# Patient Record
Sex: Female | Born: 1965
Health system: Southern US, Community
[De-identification: ages and names within clinical notes are randomized; demographics above are authoritative.]

## PROBLEM LIST (undated history)

## (undated) DIAGNOSIS — E119 Type 2 diabetes mellitus without complications: Secondary | ICD-10-CM

## (undated) DIAGNOSIS — J309 Allergic rhinitis, unspecified: Secondary | ICD-10-CM

## (undated) DIAGNOSIS — E785 Hyperlipidemia, unspecified: Secondary | ICD-10-CM

## (undated) DIAGNOSIS — J324 Chronic pansinusitis: Secondary | ICD-10-CM

## (undated) DIAGNOSIS — E559 Vitamin D deficiency, unspecified: Secondary | ICD-10-CM

## (undated) DIAGNOSIS — L309 Dermatitis, unspecified: Secondary | ICD-10-CM

## (undated) DIAGNOSIS — E0590 Thyrotoxicosis, unspecified without thyrotoxic crisis or storm: Secondary | ICD-10-CM

## (undated) DIAGNOSIS — J329 Chronic sinusitis, unspecified: Secondary | ICD-10-CM

## (undated) DIAGNOSIS — K219 Gastro-esophageal reflux disease without esophagitis: Secondary | ICD-10-CM

## (undated) DIAGNOSIS — E0500 Thyrotoxicosis with diffuse goiter without thyrotoxic crisis or storm: Secondary | ICD-10-CM

## (undated) DIAGNOSIS — R011 Cardiac murmur, unspecified: Secondary | ICD-10-CM

## (undated) HISTORY — DX: Chronic pansinusitis: J32.4

## (undated) HISTORY — DX: Gastro-esophageal reflux disease without esophagitis: K21.9

## (undated) HISTORY — DX: Chronic sinusitis, unspecified: J32.9

## (undated) HISTORY — DX: Thyrotoxicosis with diffuse goiter without thyrotoxic crisis or storm: E05.00

## (undated) HISTORY — DX: Cardiac murmur, unspecified: R01.1

## (undated) HISTORY — PX: OVARIAN CYST REMOVAL: SHX89

## (undated) HISTORY — DX: Vitamin D deficiency, unspecified: E55.9

## (undated) HISTORY — DX: Hyperlipidemia, unspecified: E78.5

## (undated) HISTORY — DX: Type 2 diabetes mellitus without complications: E11.9

## (undated) HISTORY — DX: Dermatitis, unspecified: L30.9

## (undated) HISTORY — PX: CHOLECYSTECTOMY: SHX55

## (undated) HISTORY — DX: Allergic rhinitis, unspecified: J30.9

## (undated) HISTORY — DX: Thyrotoxicosis, unspecified without thyrotoxic crisis or storm: E05.90

---

## 1998-01-10 ENCOUNTER — Ambulatory Visit (HOSPITAL_COMMUNITY): Admission: RE | Admit: 1998-01-10 | Discharge: 1998-01-10 | Payer: Self-pay | Admitting: *Deleted

## 1998-02-14 ENCOUNTER — Inpatient Hospital Stay (HOSPITAL_COMMUNITY): Admission: AD | Admit: 1998-02-14 | Discharge: 1998-02-14 | Payer: Self-pay | Admitting: *Deleted

## 1999-12-12 ENCOUNTER — Emergency Department (HOSPITAL_COMMUNITY): Admission: EM | Admit: 1999-12-12 | Discharge: 1999-12-12 | Payer: Self-pay | Admitting: Emergency Medicine

## 2000-02-02 ENCOUNTER — Encounter: Payer: Self-pay | Admitting: Gynecology

## 2000-02-02 ENCOUNTER — Ambulatory Visit (HOSPITAL_COMMUNITY): Admission: RE | Admit: 2000-02-02 | Discharge: 2000-02-02 | Payer: Self-pay | Admitting: Gynecology

## 2000-02-29 ENCOUNTER — Emergency Department (HOSPITAL_COMMUNITY): Admission: EM | Admit: 2000-02-29 | Discharge: 2000-02-29 | Payer: Self-pay

## 2000-03-15 ENCOUNTER — Encounter: Admission: RE | Admit: 2000-03-15 | Discharge: 2000-05-03 | Payer: Self-pay | Admitting: *Deleted

## 2000-04-13 ENCOUNTER — Encounter: Admission: RE | Admit: 2000-04-13 | Discharge: 2000-07-12 | Payer: Self-pay | Admitting: Gynecology

## 2000-06-23 ENCOUNTER — Inpatient Hospital Stay (HOSPITAL_COMMUNITY): Admission: AD | Admit: 2000-06-23 | Discharge: 2000-06-26 | Payer: Self-pay | Admitting: Gynecology

## 2000-07-30 ENCOUNTER — Encounter: Payer: Self-pay | Admitting: *Deleted

## 2000-07-30 ENCOUNTER — Ambulatory Visit (HOSPITAL_COMMUNITY): Admission: RE | Admit: 2000-07-30 | Discharge: 2000-07-30 | Payer: Self-pay | Admitting: *Deleted

## 2000-08-06 ENCOUNTER — Other Ambulatory Visit: Admission: RE | Admit: 2000-08-06 | Discharge: 2000-08-06 | Payer: Self-pay | Admitting: Gynecology

## 2000-08-07 ENCOUNTER — Emergency Department (HOSPITAL_COMMUNITY): Admission: EM | Admit: 2000-08-07 | Discharge: 2000-08-07 | Payer: Self-pay

## 2000-08-16 ENCOUNTER — Ambulatory Visit (HOSPITAL_COMMUNITY): Admission: RE | Admit: 2000-08-16 | Discharge: 2000-08-16 | Payer: Self-pay | Admitting: Family Medicine

## 2000-08-16 ENCOUNTER — Encounter: Payer: Self-pay | Admitting: Family Medicine

## 2001-02-12 ENCOUNTER — Emergency Department (HOSPITAL_COMMUNITY): Admission: EM | Admit: 2001-02-12 | Discharge: 2001-02-13 | Payer: Self-pay | Admitting: Emergency Medicine

## 2001-02-14 ENCOUNTER — Encounter: Payer: Self-pay | Admitting: *Deleted

## 2001-02-14 ENCOUNTER — Encounter: Admission: RE | Admit: 2001-02-14 | Discharge: 2001-02-14 | Payer: Self-pay | Admitting: *Deleted

## 2001-03-10 ENCOUNTER — Encounter (INDEPENDENT_AMBULATORY_CARE_PROVIDER_SITE_OTHER): Payer: Self-pay | Admitting: *Deleted

## 2001-03-10 ENCOUNTER — Encounter: Payer: Self-pay | Admitting: Gastroenterology

## 2001-03-10 ENCOUNTER — Ambulatory Visit (HOSPITAL_COMMUNITY): Admission: RE | Admit: 2001-03-10 | Discharge: 2001-03-10 | Payer: Self-pay | Admitting: Gastroenterology

## 2001-06-09 ENCOUNTER — Ambulatory Visit (HOSPITAL_COMMUNITY): Admission: RE | Admit: 2001-06-09 | Discharge: 2001-06-09 | Payer: Self-pay | Admitting: *Deleted

## 2001-06-09 ENCOUNTER — Encounter: Payer: Self-pay | Admitting: *Deleted

## 2001-07-18 ENCOUNTER — Ambulatory Visit (HOSPITAL_COMMUNITY): Admission: RE | Admit: 2001-07-18 | Discharge: 2001-07-18 | Payer: Self-pay | Admitting: Orthopedic Surgery

## 2001-07-18 ENCOUNTER — Encounter: Payer: Self-pay | Admitting: Orthopedic Surgery

## 2001-07-23 ENCOUNTER — Emergency Department (HOSPITAL_COMMUNITY): Admission: EM | Admit: 2001-07-23 | Discharge: 2001-07-23 | Payer: Self-pay | Admitting: Nurse Practitioner

## 2001-07-23 ENCOUNTER — Encounter: Payer: Self-pay | Admitting: Emergency Medicine

## 2002-01-13 ENCOUNTER — Encounter: Payer: Self-pay | Admitting: *Deleted

## 2002-01-13 ENCOUNTER — Encounter: Admission: RE | Admit: 2002-01-13 | Discharge: 2002-01-13 | Payer: Self-pay | Admitting: *Deleted

## 2002-06-09 ENCOUNTER — Ambulatory Visit (HOSPITAL_COMMUNITY): Admission: RE | Admit: 2002-06-09 | Discharge: 2002-06-09 | Payer: Self-pay | Admitting: *Deleted

## 2002-09-07 ENCOUNTER — Other Ambulatory Visit: Admission: RE | Admit: 2002-09-07 | Discharge: 2002-09-07 | Payer: Self-pay | Admitting: *Deleted

## 2002-10-28 ENCOUNTER — Emergency Department (HOSPITAL_COMMUNITY): Admission: EM | Admit: 2002-10-28 | Discharge: 2002-10-28 | Payer: Self-pay | Admitting: Emergency Medicine

## 2003-01-16 ENCOUNTER — Encounter (HOSPITAL_COMMUNITY): Admission: RE | Admit: 2003-01-16 | Discharge: 2003-04-16 | Payer: Self-pay | Admitting: Family Medicine

## 2003-01-16 ENCOUNTER — Encounter: Payer: Self-pay | Admitting: Family Medicine

## 2003-06-20 ENCOUNTER — Encounter: Admission: RE | Admit: 2003-06-20 | Discharge: 2003-06-20 | Payer: Self-pay | Admitting: Gastroenterology

## 2003-06-20 ENCOUNTER — Encounter: Payer: Self-pay | Admitting: Gastroenterology

## 2003-07-26 ENCOUNTER — Encounter: Payer: Self-pay | Admitting: Surgery

## 2003-07-30 ENCOUNTER — Encounter (INDEPENDENT_AMBULATORY_CARE_PROVIDER_SITE_OTHER): Payer: Self-pay

## 2003-07-30 ENCOUNTER — Observation Stay (HOSPITAL_COMMUNITY): Admission: RE | Admit: 2003-07-30 | Discharge: 2003-07-31 | Payer: Self-pay | Admitting: Surgery

## 2003-07-30 ENCOUNTER — Encounter: Payer: Self-pay | Admitting: Surgery

## 2004-01-01 ENCOUNTER — Other Ambulatory Visit: Admission: RE | Admit: 2004-01-01 | Discharge: 2004-01-01 | Payer: Self-pay | Admitting: Gynecology

## 2004-03-08 ENCOUNTER — Inpatient Hospital Stay (HOSPITAL_COMMUNITY): Admission: AD | Admit: 2004-03-08 | Discharge: 2004-03-08 | Payer: Self-pay | Admitting: Gynecology

## 2005-04-02 ENCOUNTER — Inpatient Hospital Stay (HOSPITAL_COMMUNITY): Admission: EM | Admit: 2005-04-02 | Discharge: 2005-04-04 | Payer: Self-pay | Admitting: Emergency Medicine

## 2006-03-01 ENCOUNTER — Ambulatory Visit (HOSPITAL_BASED_OUTPATIENT_CLINIC_OR_DEPARTMENT_OTHER): Admission: RE | Admit: 2006-03-01 | Discharge: 2006-03-01 | Payer: Self-pay | Admitting: Urology

## 2006-03-01 ENCOUNTER — Encounter (INDEPENDENT_AMBULATORY_CARE_PROVIDER_SITE_OTHER): Payer: Self-pay | Admitting: Specialist

## 2007-12-20 ENCOUNTER — Encounter: Admission: RE | Admit: 2007-12-20 | Discharge: 2007-12-20 | Payer: Self-pay | Admitting: Family Medicine

## 2008-01-30 ENCOUNTER — Ambulatory Visit: Payer: Self-pay | Admitting: Family Medicine

## 2011-03-13 NOTE — Discharge Summary (Signed)
Alexandra Randall, SIPE               ACCOUNT NO.:  [id number]   MEDICAL RECORD NO.:  [id number]          PATIENT TYPE:  INP   LOCATION:  3712                         FACILITY:  [facility]   PHYSICIAN:  Jonna L. Robb Matar, M.D.DATE OF BIRTH:  01/04/1966   DATE OF ADMISSION:  04/02/2005  DATE OF DISCHARGE:  04/04/2005                                 DISCHARGE SUMMARY   PRIMARY CARE PHYSICIAN:  Manson Passey [facility].   FINAL DIAGNOSES:  1.  Right lower lobe pneumonia.  2. Left otitis media.  3. Acute sphenoid      sinusitis.  4. Hypokalemia.  5. Type 2 diabetes.  6. Possible tonic-      clonic seizure.   HISTORY:  This is a 45 year old African-American female, who had a two-day  history of fever, congestion, nonproductive cough, myalgias, nausea, left-  sided chest pain, and in the office, she had had a witnessed seizure,  described as tonic-clonic.   PAST MEDICAL HISTORY:  Diet-controlled diabetes.  She was having frequent  urination.   PHYSICAL EXAMINATION:  VITAL SIGNS:  Notable for 102.5 fever, blood pressure  100/70.  HEENT:  Significant for dullness and bulging of the left ear drum.  CHEST:  Right lower lung field crackles.   LABORATORY DATA:  Initial laboratory data showed a potassium of 2.8, white  count 11.3, glucose 207, and chest x-ray showed a 4- to 5-inch mass on x-ray  with an air bronchogram.   HOSPITAL COURSE:  The patient was put on Rocephin and Zithromax and changed  to Avelox.  CAT scan of the head was unremarkable.  CAT scan of the chest  just showed pneumonia without any mass or abscess.  She had a loading dose  of Dilantin but then was decided that she did not need to have any further  Dilantin and this was discontinued.  Her potassium was replaced vigorously  and was 4.7 at the time of discharge.  She was put on sliding-scale insulin  for her sugars.   DISPOSITION:  The patient will be discharged on Avelox 400 mg daily for a  week, K-Dur 20 daily,  Mucinex b.i.d., Glucotrol XL 2.5 (one half tablet  daily).  She is to be followed up outpatient in one week and she will get a  repeat chest x-ray in one month.  White count at discharge was 4.7,  potassium up to 4.7, albumin was only 2.7.       JLB/MEDQ  D:  04/04/2005  T:  04/04/2005  Job:  [id number]   cc:   Olena Leatherwood [facility]

## 2011-03-13 NOTE — Op Note (Signed)
Alexandra Randall, Alexandra Randall               ACCOUNT NO.:  [id number]   MEDICAL RECORD NO.:  [id number]          PATIENT TYPE:  AMB   LOCATION:  NESC                         FACILITY:  [facility]   PHYSICIAN:  Maretta Bees. Vonita Moss, M.D.DATE OF BIRTH:  11/16/1965   DATE OF PROCEDURE:  03/01/2006  DATE OF DISCHARGE:                                 OPERATIVE REPORT   PREOPERATIVE DIAGNOSIS:  Rule out interstitial cystitis.   POSTOPERATIVE DIAGNOSIS:  Rule out interstitial cystitis.   PROCEDURE:  Cystoscopy, hydraulic overdistention of the bladder and cold cup  bladder biopsy.   SURGEON:  Maretta Bees. Vonita Moss, M.D.   ANESTHESIA:  General.   INDICATIONS:  This 45 year old lady has had an 56-month history of  progressive frequency both day and night with pressure in the bladder and  pain when the bladder gets full.  She also voids every 20 minutes.  She had  a pelvic pain and symptom score of 21, which was worrisome for IC, and she  was brought to the OR today for further evaluation.   PROCEDURE:  The patient was brought to the operating room and placed in  lithotomy position and the external genitalia were prepped and draped in the  usual fashion.  She was cystoscoped and the bladder was normal with no  stones, tumors or inflammatory lesions.  At 36 inches of water pressure she  was filled to 900 mL and then emptied out, and she developed submucosal  petechiae and hemorrhage in all four quadrants of the bladder consistent  with IC.  Hemorrhagic areas on the bladder base were biopsied and the biopsy  sites fulgurated with the Bugbee electrode.  The bladder was emptied, the  scope removed and B&O suppository inserted per rectum and she was taken to  the operating room in good condition, having tolerated the procedure well.      Maretta Bees. Vonita Moss, M.D.  Electronically Signed     LJP/MEDQ  D:  03/01/2006  T:  03/02/2006  Job:  [id number]

## 2011-03-13 NOTE — Discharge Summary (Signed)
[facility]  Patient:    Alexandra Randall, Alexandra Randall                      MRN: [id number] Adm. Date:  [id number] Disc. Date: [id number] Attending:  Wetzel Bjornstad Dictator:   Antony Contras, [facility]                           Discharge Summary  DISCHARGE DIAGNOSES:          1. Intrauterine pregnancy at 39+ weeks.                               2. History of gestational diabetes, diet                                  controlled.                               3. Oligohydramnios.                               4. Intrauterine growth restriction.  HISTORY OF THE PRESENT ILLNESS:  Patient is a 45 year old gravida 4, para 2-0-1-2 with LMP October 01, 1999, [facility] July 07, 2000.  Pregnancy was complicated by later prenatal care, choriod plexus cyst -- normal amniocentesis -- gestational diabetes, which was diet-controlled, oligohydramnios and an IUGR infant which was discovered on ultrasound at 39 weeks.  PRENATAL LABORATORY DATA:     Blood type is A-positive, antibody screen negative.  Sickle cell negative.  RPR, HBsAg, HIV nonreactive.  Rubella immune.  MSAFP normal.  Amniocentesis normal.  GBS was negative.  HOSPITAL COURSE AND TREATMENT:  Patient was admitted on June 24, 2000 for induction of labor secondary to oligohydramnios and IUGR noted on ultrasound done that day in the office.  At the time of admission, her fasting blood sugars were less than 90, two-hours were less than 120 and she did have reactive nonstress test in the office which did show multiple variable decelerations.  She did deliver precipitously several hours after admission a 4-pound 10-ounce infant with Apgars 8/9.  Postpartum course:  She remained afebrile and had no difficulty voiding and was able to be discharged on her first postoperative day with her infant.  Postpartum CBC was not on chart.  DISPOSITION:                  Follow up in the office in six weeks.  Continue prenatal  vitamins and iron, Motrin and Tylox for pain. DD:  07/16/00 TD:  07/18/00 Job: [id number]
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
## 2011-03-13 NOTE — Op Note (Signed)
NAMEASHANTY, COLTRANE                           ACCOUNT NO.:  [id number]   MEDICAL RECORD NO.:  [id number]                   PATIENT TYPE:  AMB   LOCATION:  DAY                                  FACILITY:  [facility]   PHYSICIAN:  Abigail Miyamoto, M.D.              DATE OF BIRTH:  1966-07-06   DATE OF PROCEDURE:  07/30/2003  DATE OF DISCHARGE:                                 OPERATIVE REPORT   PREOPERATIVE DIAGNOSES:  1. Abdominal pain.  2. Gallbladder polyp.  3. Biliary dyskinesia.   POSTOPERATIVE DIAGNOSES:  1. Abdominal pain.  2. Gallbladder polyp.  3. Biliary dyskinesia.   PROCEDURE:  Laparoscopic cholecystectomy with intraoperative cholangiogram.   SURGEON:  Abigail Miyamoto, M.D.   ASSISTANT:  Ollen Gross. Carolynne Edouard, M.D.   ANESTHESIA:  General endotracheal anesthesia and 0.25% Marcaine.   ESTIMATED BLOOD LOSS:  Minimal.   FINDINGS:  The patient was found to have a chronically scarred-appearing  gallbladder.  She had a large amount of adhesions from the dome of the liver  to the diaphragm, which were excised.  Cholangiogram was normal.   PROCEDURE IN DETAIL:  The patient was brought to the operating room,  identified as Alexandra Randall.  She was placed supine upon the operating room  table and general anesthesia was induced.  The patient's abdomen was then  prepped and draped in the usual sterile fashion.  Using a #15 blade, a small  transverse incision was made through a previous scar at the umbilicus.  The  incision was carried down through the fascia, which was then opened with a  scalpel.  A hemostat was then used to pass through the peritoneal cavity.  Next a 0 Vicryl pursestring suture was placed around the fascial opening.  The Hasson port was placed through the opening and insufflation of the  abdomen was begun.  Next a 12 mm port was placed in the patient's  epigastrium and two 5 mm ports were placed in the patient's right flank  under direct vision.  The gallbladder was  then grasped and retracted above  the liver bed.  Dissection was then carried out at the base of the  gallbladder.  The patient was found to have a large amount of adhesions from  the dome of the liver to the dome of the diaphragm, and these were taken  down with the electrocautery and laparoscopic scissors.  The cystic duct was  then dissected out.  It was clipped once distally and partly transected with  the laparoscopic scissors.  A cholangiocatheter was then passed through the  skin and placed into the opening in the cystic duct under direct vision.  A  cholangiogram was then performed under direct fluoroscopy.  Good flow of  contrast was seen into the entire biliary system.  The cholangiocatheter was  then completely removed.  The cystic duct was then clipped four times  proximally and transected with  scissors.  The cystic artery was identified  and clipped twice proximally and once distally and transected as well.  The  gallbladder was then slowly dissected free from the liver bed with the  electrocautery.  Once the gallbladder was free from the liver bed, it was  pulled out through the incision at the umbilicus.  The abdomen was then  irrigated with normal saline.  The liver bed was again examined and  hemostasis was achieved with the cautery.  All ports were then removed under  direct vision and the abdomen was deflated.  All incisions were anesthetized  with 0.25% Marcaine and then closed with 4-0 Monocryl subcuticular sutures.  Steri-Strips, gauze, and tape were then applied.  The patient tolerated the  procedure well.  All sponge, needle, and instruments were correct at the end  of the procedure.  The patient was then extubated in the operating room and  taken in a stable condition to the recovery room.                                               Abigail Miyamoto, M.D.    DB/MEDQ  D:  07/30/2003  T:  07/30/2003  Job:  [id number]   cc:   Anselmo Rod, M.D.  [address]  [address]  [place]  [place] [address]  Fax: [phone]

## 2013-07-13 ENCOUNTER — Ambulatory Visit: Payer: Self-pay | Admitting: Family Medicine

## 2013-07-25 ENCOUNTER — Encounter: Payer: Self-pay | Admitting: Family Medicine

## 2013-07-25 ENCOUNTER — Ambulatory Visit (INDEPENDENT_AMBULATORY_CARE_PROVIDER_SITE_OTHER): Payer: BC Managed Care – PPO | Admitting: Family Medicine

## 2013-07-25 ENCOUNTER — Other Ambulatory Visit: Payer: Self-pay | Admitting: Family Medicine

## 2013-07-25 VITALS — BP 142/86 | HR 84 | Temp 98.3°F | Resp 18 | Ht 64.0 in | Wt 160.0 lb

## 2013-07-25 DIAGNOSIS — Z23 Encounter for immunization: Secondary | ICD-10-CM

## 2013-07-25 DIAGNOSIS — Z0000 Encounter for general adult medical examination without abnormal findings: Secondary | ICD-10-CM

## 2013-07-25 DIAGNOSIS — E0500 Thyrotoxicosis with diffuse goiter without thyrotoxic crisis or storm: Secondary | ICD-10-CM | POA: Insufficient documentation

## 2013-07-25 DIAGNOSIS — E785 Hyperlipidemia, unspecified: Secondary | ICD-10-CM | POA: Insufficient documentation

## 2013-07-25 LAB — CBC WITH DIFFERENTIAL/PLATELET
Basophils Absolute: 0 10*3/uL (ref 0.0–0.1)
Basophils Relative: 1 % (ref 0–1)
Eosinophils Absolute: 0.4 10*3/uL (ref 0.0–0.7)
Eosinophils Relative: 6 % — ABNORMAL HIGH (ref 0–5)
HCT: 43 % (ref 36.0–46.0)
Hemoglobin: 14.8 g/dL (ref 12.0–15.0)
Lymphocytes Relative: 48 % — ABNORMAL HIGH (ref 12–46)
Lymphs Abs: 2.7 10*3/uL (ref 0.7–4.0)
MCH: 31.7 pg (ref 26.0–34.0)
MCHC: 34.4 g/dL (ref 30.0–36.0)
MCV: 92.1 fL (ref 78.0–100.0)
Monocytes Absolute: 0.5 10*3/uL (ref 0.1–1.0)
Monocytes Relative: 9 % (ref 3–12)
Neutro Abs: 2 10*3/uL (ref 1.7–7.7)
Neutrophils Relative %: 36 % — ABNORMAL LOW (ref 43–77)
Platelets: 331 10*3/uL (ref 150–400)
RBC: 4.67 MIL/uL (ref 3.87–5.11)
RDW: 14.3 % (ref 11.5–15.5)
WBC: 5.6 10*3/uL (ref 4.0–10.5)

## 2013-07-25 LAB — COMPLETE METABOLIC PANEL WITH GFR
ALT: 16 U/L (ref 0–35)
AST: 18 U/L (ref 0–37)
Albumin: 4.1 g/dL (ref 3.5–5.2)
Alkaline Phosphatase: 69 U/L (ref 39–117)
BUN: 13 mg/dL (ref 6–23)
CO2: 27 mEq/L (ref 19–32)
Calcium: 9.5 mg/dL (ref 8.4–10.5)
Chloride: 107 mEq/L (ref 96–112)
Creat: 0.79 mg/dL (ref 0.50–1.10)
GFR, Est African American: >89 mL/min
GFR, Est Non African American: 89 mL/min
Glucose, Bld: 77 mg/dL (ref 70–99)
Potassium: 4.9 mEq/L (ref 3.5–5.3)
Sodium: 142 mEq/L (ref 135–145)
Total Bilirubin: 0.2 mg/dL — ABNORMAL LOW (ref 0.3–1.2)
Total Protein: 7.8 g/dL (ref 6.0–8.3)

## 2013-07-25 LAB — LIPID PANEL
Cholesterol: 283 mg/dL — ABNORMAL HIGH (ref 0–200)
HDL: 45 mg/dL (ref >39)
Total CHOL/HDL Ratio: 6.3 Ratio
Triglycerides: 407 mg/dL — ABNORMAL HIGH (ref <150)

## 2013-07-25 LAB — TSH: TSH: 0.29 u[IU]/mL — ABNORMAL LOW (ref 0.350–4.500)

## 2013-07-25 NOTE — Progress Notes (Signed)
Subjective:    Patient ID: Alexandra Randall, female    DOB: 1966/08/09, 47 y.o.   MRN: [id number]  HPI Patient is a 47 year old Latin American female who is here today for complete physical exam. She continues to smoke and is not ready to quit. She has a remote history of Graves' disease. She is on no medication she is overdue to recheck a TSH. She also has a history of hyperlipidemia. She has been off Lipitor for over 7 months and is overdue to recheck a fasting lipid panel. She declined her flu shot today. She is overdue for a tetanus vaccine and does request that today. Her mammogram is due later this year and she normally schedules that.  Her blood pressure is elevated today. It was also elevated recently in urgent care where she was treated for sinus infection.  She does not have a previous history of hypertension. Past Medical History  Diagnosis Date  . GERD (gastroesophageal reflux disease)   . Eczema   . Hyperlipidemia   . Graves disease    No current outpatient prescriptions on file prior to visit.   No current facility-administered medications on file prior to visit.   Allergies  Allergen Reactions  . Hydrocodone Other (See Comments)    Heart palpitations    History   Social History  . Marital Status: Divorced    Spouse Name: N/A    Number of Children: N/A  . Years of Education: N/A   Occupational History  . Not on file.   Social History Main Topics  . Smoking status: Current Every Day Smoker    Types: Cigarettes  . Smokeless tobacco: Never Used  . Alcohol Use: Yes     Comment: Occasion  . Drug Use: No  . Sexual Activity: Yes   Other Topics Concern  . Not on file   Social History Narrative  . No narrative on file   Family History  Problem Relation Age of Onset  . Cancer Mother 71    throat  . Cancer Father 47    lung      Review of Systems  All other systems reviewed and are negative.       Objective:   Physical Exam  Vitals  reviewed. Constitutional: She is oriented to person, place, and time. She appears well-developed and well-nourished. No distress.  HENT:  Head: Normocephalic and atraumatic.  Right Ear: External ear normal.  Left Ear: External ear normal.  Nose: Nose normal.  Mouth/Throat: Oropharynx is clear and moist. No oropharyngeal exudate.  Eyes: Conjunctivae and EOM are normal. Pupils are equal, round, and reactive to light. Right eye exhibits no discharge. Left eye exhibits no discharge. No scleral icterus.  Neck: Normal range of motion. Neck supple. No JVD present. No tracheal deviation present. No thyromegaly present.  Cardiovascular: Normal rate, regular rhythm and intact distal pulses.  Exam reveals no gallop and no friction rub.   Murmur heard. Pulmonary/Chest: Effort normal and breath sounds normal. No stridor. No respiratory distress. She has no wheezes. She has no rales. She exhibits no tenderness.  Abdominal: Soft. Bowel sounds are normal. She exhibits no distension and no mass. There is no tenderness. There is no rebound and no guarding.  Genitourinary: Vagina normal and uterus normal. No vaginal discharge found.  Musculoskeletal: Normal range of motion. She exhibits no edema and no tenderness.  Lymphadenopathy:    She has no cervical adenopathy.  Neurological: She is alert and oriented to person, place, and time.  She has normal reflexes. She displays normal reflexes. No cranial nerve deficit. She exhibits normal muscle tone. Coordination normal.  Skin: Skin is warm. No rash noted. She is not diaphoretic. No erythema. No pallor.  Psychiatric: She has a normal mood and affect. Her behavior is normal. Judgment and thought content normal.    Breast exam is normal. She has no cervical motion tenderness. She has no adnexal masses.      Assessment & Plan:  1. Routine general medical examination at a health care facility Patient declines a flu shot. She was given a tetanus shot today. She will  schedule her mammogram. Pap smear sent today and I will await results. Check a CBC fasting lipid panel TSH and CMP. Her blood pressure is elevated today. I asked her to check it everyday at home for the next week and membrane in values in one week. If her blood pressures greater than 140/90 he'll recommend medication. Also recommended low sodium diet. Also recommended smoking cessation. Patient's breast exam is normal - PAP, Thin Prep w/HPV rflx HPV Type 16/18 - COMPLETE METABOLIC PANEL WITH GFR - CBC with Differential - Lipid panel - TSH  2. Need for prophylactic vaccination and inoculation against unspecified single disease - Tdap vaccine greater than or equal to 7yo IM

## 2013-07-27 ENCOUNTER — Encounter: Payer: Self-pay | Admitting: Family Medicine

## 2013-07-27 LAB — PAP, THIN PREP W/HPV RFLX HPV TYPE 16/18: HPV DNA High Risk: NOT DETECTED

## 2013-07-27 NOTE — Telephone Encounter (Signed)
Call her with lab results  [phone]

## 2013-07-28 ENCOUNTER — Other Ambulatory Visit: Payer: Self-pay | Admitting: Family Medicine

## 2013-07-28 DIAGNOSIS — Z79899 Other long term (current) drug therapy: Secondary | ICD-10-CM

## 2013-07-28 DIAGNOSIS — E782 Mixed hyperlipidemia: Secondary | ICD-10-CM

## 2013-07-28 LAB — T4, FREE: Free T4: 1.1 ng/dL (ref 0.80–1.80)

## 2013-07-28 LAB — T3, FREE: T3, Free: 3.2 pg/mL (ref 2.3–4.2)

## 2013-07-28 MED ORDER — CHOLINE FENOFIBRATE 135 MG PO CPDR: 135.0000 mg | DELAYED_RELEASE_CAPSULE | Freq: Every day | ORAL | Status: DC

## 2013-07-28 NOTE — Telephone Encounter (Signed)
This encounter was created in error - please disregard.

## 2013-08-01 ENCOUNTER — Telehealth: Payer: Self-pay | Admitting: Family Medicine

## 2013-08-01 NOTE — Telephone Encounter (Signed)
Patient calling to find out about her results and about her appointment for her surgery.

## 2013-08-02 ENCOUNTER — Telehealth: Payer: Self-pay | Admitting: Family Medicine

## 2013-08-02 NOTE — Telephone Encounter (Signed)
Patient is calling back  °

## 2013-08-03 ENCOUNTER — Other Ambulatory Visit: Payer: Self-pay | Admitting: Family Medicine

## 2013-08-03 ENCOUNTER — Telehealth: Payer: Self-pay | Admitting: Family Medicine

## 2013-08-03 DIAGNOSIS — L723 Sebaceous cyst: Secondary | ICD-10-CM

## 2013-08-03 NOTE — Telephone Encounter (Signed)
Yes, I apologize.

## 2013-08-03 NOTE — Telephone Encounter (Signed)
Patient aware.

## 2013-08-03 NOTE — Telephone Encounter (Signed)
Pt states that we were going to send her to see a surgeon for the cyst on her back?????

## 2013-08-10 ENCOUNTER — Telehealth: Payer: Self-pay | Admitting: Family Medicine

## 2013-08-10 NOTE — Telephone Encounter (Signed)
Form for Glucose testing supplies.  Meter, lancing device, lancets and strips.  Test 3 x day  QS for 90 days.  Form signed by provider and faxed

## 2013-08-15 ENCOUNTER — Encounter (INDEPENDENT_AMBULATORY_CARE_PROVIDER_SITE_OTHER): Payer: Self-pay | Admitting: Surgery

## 2013-08-21 ENCOUNTER — Ambulatory Visit (INDEPENDENT_AMBULATORY_CARE_PROVIDER_SITE_OTHER): Payer: BC Managed Care – PPO | Admitting: Surgery

## 2013-08-21 ENCOUNTER — Other Ambulatory Visit (INDEPENDENT_AMBULATORY_CARE_PROVIDER_SITE_OTHER): Payer: Self-pay | Admitting: Surgery

## 2013-08-21 ENCOUNTER — Encounter (INDEPENDENT_AMBULATORY_CARE_PROVIDER_SITE_OTHER): Payer: Self-pay

## 2013-08-21 ENCOUNTER — Encounter (INDEPENDENT_AMBULATORY_CARE_PROVIDER_SITE_OTHER): Payer: Self-pay | Admitting: Surgery

## 2013-08-21 VITALS — BP 122/68 | HR 68 | Resp 16 | Ht 64.5 in | Wt 157.2 lb

## 2013-08-21 DIAGNOSIS — R229 Localized swelling, mass and lump, unspecified: Secondary | ICD-10-CM

## 2013-08-21 DIAGNOSIS — M25461 Effusion, right knee: Secondary | ICD-10-CM

## 2013-08-21 DIAGNOSIS — R222 Localized swelling, mass and lump, trunk: Secondary | ICD-10-CM

## 2013-08-21 DIAGNOSIS — M25561 Pain in right knee: Secondary | ICD-10-CM

## 2013-08-21 DIAGNOSIS — M25569 Pain in unspecified knee: Secondary | ICD-10-CM

## 2013-08-21 NOTE — Progress Notes (Signed)
Patient ID: Alexandra Randall, female   DOB: 1966-01-05, 47 y.o.   MRN: [id number]  Chief Complaint  Patient presents with  . New Evaluation    eval seb cyst on back    HPI Alexandra Randall is a 47 y.o. female.   HPI This is a 47 year old female referred by Dr. Lynnea Ferrier for evaluation of a mass on her back. It has been present for several years but is now becoming larger. It has never drained. It has never become erythematous but does fall some mild discomfort. She actually reports maybe she also just recently fell in her bathroom and landed on her right knee and since then has had swelling and pain in her right lower extremity. Past Medical History  Diagnosis Date  . GERD (gastroesophageal reflux disease)   . Eczema   . Hyperlipidemia   . Graves disease   . Diabetes mellitus without complication   . Heart murmur     Past Surgical History  Procedure Laterality Date  . Cholecystectomy    . Ovarian cyst removal      Family History  Problem Relation Age of Onset  . Cancer Mother 71    throat  . Cancer Father 30    lung    Social History History  Substance Use Topics  . Smoking status: Current Every Day Smoker    Types: Cigarettes  . Smokeless tobacco: Never Used  . Alcohol Use: Yes     Comment: Occasion    Allergies  Allergen Reactions  . Hydrocodone Other (See Comments)    Heart palpitations     Current Outpatient Prescriptions  Medication Sig Dispense Refill  . Blood Glucose Monitoring Suppl (ONE TOUCH ULTRA MINI) W/DEVICE KIT       . Choline Fenofibrate 135 MG capsule Take 1 capsule (135 mg total) by mouth daily.  30 capsule  5  . EASY TOUCH LANCETS 30G/TWIST MISC       . Lancet Devices (EASY TOUCH LANCING DEVICE) MISC       . ONE TOUCH ULTRA TEST test strip        No current facility-administered medications for this visit.    Review of Systems Review of Systems  Constitutional: Negative for fever, chills and unexpected weight change.  HENT:  Negative for congestion, hearing loss, sore throat, trouble swallowing and voice change.   Eyes: Negative for visual disturbance.  Respiratory: Negative for cough and wheezing.   Cardiovascular: Positive for leg swelling. Negative for chest pain and palpitations.  Gastrointestinal: Negative for nausea, vomiting, abdominal pain, diarrhea, constipation, blood in stool, abdominal distention and anal bleeding.  Genitourinary: Negative for hematuria, vaginal bleeding and difficulty urinating.  Musculoskeletal: Negative for arthralgias.  Skin: Negative for rash and wound.  Neurological: Negative for seizures, syncope and headaches.  Hematological: Negative for adenopathy. Does not bruise/bleed easily.  Psychiatric/Behavioral: Negative for confusion.    Blood pressure 122/68, pulse 68, resp. rate 16, height 5' 4.5" (1.638 m), weight 157 lb 3.2 oz (71.305 kg), last menstrual period 05/24/2013.  Physical Exam Physical Exam  Constitutional: She is oriented to person, place, and time. She appears well-developed and well-nourished. No distress.  HENT:  Head: Normocephalic and atraumatic.  Right Ear: External ear normal.  Left Ear: External ear normal.  Nose: Nose normal.  Mouth/Throat: Oropharynx is clear and moist. No oropharyngeal exudate.  Eyes: Conjunctivae are normal. Pupils are equal, round, and reactive to light. Right eye exhibits no discharge. Left eye exhibits no discharge. No  scleral icterus.  Neck: Neck supple. No tracheal deviation present. No thyromegaly present.  Cardiovascular: Normal rate, regular rhythm, normal heart sounds and intact distal pulses.   No murmur heard. Pulmonary/Chest: Effort normal and breath sounds normal. No respiratory distress. She has no wheezes.  Abdominal: Soft. Bowel sounds are normal. She exhibits no distension. There is no tenderness.  Musculoskeletal: Normal range of motion. She exhibits edema and tenderness.  There is tenderness and swelling of her  right knee. Range of motion seems normal. She is able to bear weight  Lymphadenopathy:    She has no cervical adenopathy.  Neurological: She is alert and oriented to person, place, and time.  Skin: Skin is warm and dry. She is not diaphoretic.  There is a 4 cm mass on her left lower back near the midline which is soft and mobile. There are no skin changes  Psychiatric: Her behavior is normal. Judgment normal.    Data Reviewed I reviewed the notes from her primary care physician  Assessment    #1 left back mass  #2 right knee pain and swelling status post fall    Plan    Regarding the back mass, I suspect this is a benign cyst or lipoma. It is getting larger so removal was recommended. She would like to wait until January which I feel is reasonable. Regarding her right knee, I'm going to refer her to [place] [facility] for evaluation.        Mali Eppard A 08/21/2013, 2:23 PM

## 2013-11-27 ENCOUNTER — Ambulatory Visit: Payer: BC Managed Care – PPO | Admitting: Family Medicine

## 2014-03-20 ENCOUNTER — Encounter (INDEPENDENT_AMBULATORY_CARE_PROVIDER_SITE_OTHER): Payer: Self-pay | Admitting: Surgery

## 2014-03-20 ENCOUNTER — Ambulatory Visit (INDEPENDENT_AMBULATORY_CARE_PROVIDER_SITE_OTHER): Payer: BC Managed Care – PPO | Admitting: Surgery

## 2014-03-20 VITALS — BP 128/76 | HR 72 | Temp 98.0°F | Resp 18 | Ht 64.0 in | Wt 166.0 lb

## 2014-03-20 DIAGNOSIS — R229 Localized swelling, mass and lump, unspecified: Secondary | ICD-10-CM

## 2014-03-20 DIAGNOSIS — R222 Localized swelling, mass and lump, trunk: Secondary | ICD-10-CM

## 2014-03-20 NOTE — Progress Notes (Signed)
Subjective:     Patient ID: Alexandra Randall, female   DOB: 12/09/1965, 48 y.o.   MRN: [id number]  HPI She is here today to go ahead and schedule excision of her back mass. She's been doing well and is otherwise no complaints  Review of Systems     Objective:   Physical Exam On exam, there is a firm, minimally mobile mass which is approximately 4 cm on the lower back    Assessment:     Lower back mass     Plan:     Again, I suspect this is either a lipoma or sebaceous cyst. Removal is recommended in the operating room as it is symptomatic. Surgery will be scheduled. Please refer to my previous evaluation of the patient.

## 2014-03-28 ENCOUNTER — Encounter (HOSPITAL_COMMUNITY)
Admission: RE | Admit: 2014-03-28 | Discharge: 2014-03-28 | Disposition: A | Discharge status: Home / Self Care | Payer: BC Managed Care – PPO | Source: Ambulatory Visit | Attending: Anesthesiology | Admitting: Anesthesiology

## 2014-03-28 ENCOUNTER — Encounter (HOSPITAL_COMMUNITY)
Admission: RE | Admit: 2014-03-28 | Discharge: 2014-03-28 | Disposition: A | Discharge status: Home / Self Care | Payer: BC Managed Care – PPO | Source: Ambulatory Visit | Attending: Surgery | Admitting: Surgery

## 2014-03-28 ENCOUNTER — Encounter (HOSPITAL_COMMUNITY): Payer: Self-pay | Admitting: Pharmacy Technician

## 2014-03-28 ENCOUNTER — Encounter (HOSPITAL_COMMUNITY): Payer: Self-pay

## 2014-03-28 DIAGNOSIS — Z01812 Encounter for preprocedural laboratory examination: Secondary | ICD-10-CM | POA: Insufficient documentation

## 2014-03-28 DIAGNOSIS — Z01810 Encounter for preprocedural cardiovascular examination: Secondary | ICD-10-CM | POA: Insufficient documentation

## 2014-03-28 DIAGNOSIS — Z01818 Encounter for other preprocedural examination: Secondary | ICD-10-CM | POA: Insufficient documentation

## 2014-03-28 LAB — HCG, SERUM, QUALITATIVE: Preg, Serum: NEGATIVE

## 2014-03-28 LAB — BASIC METABOLIC PANEL
BUN: 10 mg/dL (ref 6–23)
CO2: 25 mEq/L (ref 19–32)
Calcium: 9.8 mg/dL (ref 8.4–10.5)
Chloride: 101 mEq/L (ref 96–112)
Creatinine, Ser: 0.67 mg/dL (ref 0.50–1.10)
GFR calc Af Amer: >90 mL/min (ref >90)
GFR calc non Af Amer: >90 mL/min (ref >90)
Glucose, Bld: 89 mg/dL (ref 70–99)
Potassium: 4.1 mEq/L (ref 3.7–5.3)
Sodium: 138 mEq/L (ref 137–147)

## 2014-03-28 LAB — CBC
HCT: 41.3 % (ref 36.0–46.0)
Hemoglobin: 14.8 g/dL (ref 12.0–15.0)
MCH: 32.6 pg (ref 26.0–34.0)
MCHC: 35.8 g/dL (ref 30.0–36.0)
MCV: 91 fL (ref 78.0–100.0)
Platelets: 278 10*3/uL (ref 150–400)
RBC: 4.54 MIL/uL (ref 3.87–5.11)
RDW: 13.7 % (ref 11.5–15.5)
WBC: 6.1 10*3/uL (ref 4.0–10.5)

## 2014-03-28 NOTE — Pre-Procedure Instructions (Signed)
Alexandra Randall  03/28/2014   Your procedure is scheduled on:  Tuesday June 9 th at 1106 AM  [facility] at [phone] AM.  Call this number if you have problems the morning of surgery: [phone]   Remember:   Do not eat food or drink liquids after midnight.   Take these medicines the morning of surgery with A SIP OF WATER: None   Stop Aspirin, Meloxicam, Fish oil , Niacin, and herbal medication 5 days prior to surgery.  Do not wear jewelry, make-up or nail polish.  Do not wear lotions, powders, or perfumes. You may wear deodorant.  Do not shave 48 hours prior to surgery.   Do not bring valuables to the hospital.  [facility] is not responsible for any belongings or valuables.               Contacts, dentures or bridgework may not be worn into surgery.  Leave suitcase in the car. After surgery it may be brought to your room.  For patients admitted to the hospital, discharge time is determined by your treatment team.               Patients discharged the day of surgery will not be allowed to drive home.    Special Instructions: [facility] - Preparing for Surgery  Before surgery, you can play an important role.  Because skin is not sterile, your skin needs to be as free of germs as possible.  You can reduce the number of germs on you skin by washing with CHG (chlorahexidine gluconate) soap before surgery.  CHG is an antiseptic cleaner which kills germs and bonds with the skin to continue killing germs even after washing.  Please DO NOT use if you have an allergy to CHG or antibacterial soaps.  If your skin becomes reddened/irritated stop using the CHG and inform your nurse when you arrive at Short Stay.  Do not shave (including legs and underarms) for at least 48 hours prior to the first CHG shower.  You may shave your face.  Please follow these instructions carefully:   1.  Shower with CHG Soap the night before surgery and the  morning of Surgery.  2.   If you choose to wash your hair, wash your hair first as usual with your       normal shampoo.  3.  After you shampoo, rinse your hair and body thoroughly to remove the                      Shampoo.  4.  Use CHG as you would any other liquid soap.  You can apply chg directly       to the skin and wash gently with scrungie or a clean washcloth.  5.  Apply the CHG Soap to your body ONLY FROM THE NECK DOWN.        Do not use on open wounds or open sores.  Avoid contact with your eyes,       ears, mouth and genitals (private parts).  Wash genitals (private parts)       with your normal soap.  6.  Wash thoroughly, paying special attention to the area where your surgery        will be performed.  7.  Thoroughly rinse your body with warm water from the neck down.  8.  DO NOT shower/wash with your normal soap after using and rinsing off  the CHG Soap.  9.  Pat yourself dry with a clean towel.            10.  Wear clean pajamas.            11.  Place clean sheets on your bed the night of your first shower and do not        sleep with pets.  Day of Surgery  Do not apply any lotions/deoderants the morning of surgery.  Please wear clean clothes to the hospital/surgery center.      Please read over the following fact sheets that you were given: Pain Booklet, Coughing and Deep Breathing, MRSA Information and Surgical Site Infection Prevention

## 2014-03-29 NOTE — Progress Notes (Signed)
Anesthesia chart review: Patient is a 48 year old female scheduled for excision of back mass on 04/03/14 by Dr. Abigail Miyamoto. Lesion is thought to be a benign cyst of lipoma, but since it has been growing excision was recommended.   History includes smoking, diabetes mellitus type 2, heart murmur (not specified), hyperlipidemia, Graves' disease '09, GERD, eczema, cholecystectomy. PCP is Dr. Tanya Nones.  EKG on 03/28/14 showed: SB @ 57 bpm, possible LAE, low voltage QRS, incomplete right BBB, cannot rule out anterior infarct (age undetermined), inferior infarct (age undetermined).  QRS voltage is lower, but otherwise I don't think her EKG is significantly changed since 07/26/03 (see Muse).  Preoperative CXR and labs WNL.  I think her EKG is overall stable for the past 9-10 years.  No CV symptoms documented at her PAT visit.  If no acute changes or new CV symptoms then I would anticipate that she could proceed as planned.  Shonna Chock, PA-C [facility] Stay Center/Anesthesiology Phone [phone] 03/29/2014 6:00 PM

## 2014-04-02 MED ORDER — CEFAZOLIN SODIUM-DEXTROSE 2-3 GM-% IV SOLR
2.0000 g | INTRAVENOUS | Status: AC
  Administered 2014-04-03: 2 g via INTRAVENOUS
  Filled 2014-04-02: qty 50

## 2014-04-02 NOTE — H&P (Signed)
Chief Complaint   Patient presents with   .  New Evaluation     eval seb cyst on back   HPI  Alexandra Randall is a 48 y.o. female.  HPI  This is a 48 year old female referred by Dr. Jenna Luo for evaluation of a mass on her back. It has been present for several years but is now becoming larger. It has never drained. It has never become erythematous but does fall some mild discomfort. She actually reports maybe she also just recently fell in her bathroom and landed on her right knee and since then has had swelling and pain in her right lower extremity.  Past Medical History   Diagnosis  Date   .  GERD (gastroesophageal reflux disease)    .  Eczema    .  Hyperlipidemia    .  Graves disease    .  Diabetes mellitus without complication    .  Heart murmur     Past Surgical History   Procedure  Laterality  Date   .  Cholecystectomy     .  Ovarian cyst removal      Family History   Problem  Relation  Age of Onset   .  Cancer  Mother  71     throat   .  Cancer  Father  48     lung   Social History  History   Substance Use Topics   .  Smoking status:  Current Every Day Smoker     Types:  Cigarettes   .  Smokeless tobacco:  Never Used   .  Alcohol Use:  Yes      Comment: Occasion    Allergies   Allergen  Reactions   .  Hydrocodone  Other (See Comments)     Heart palpitations    Current Outpatient Prescriptions   Medication  Sig  Dispense  Refill   .  Blood Glucose Monitoring Suppl (ONE TOUCH ULTRA MINI) W/DEVICE KIT      .  Choline Fenofibrate 135 MG capsule  Take 1 capsule (135 mg total) by mouth daily.  30 capsule  5   .  EASY TOUCH LANCETS 30G/TWIST MISC      .  Lancet Devices (EASY TOUCH LANCING DEVICE) MISC      .  ONE TOUCH ULTRA TEST test strip       No current facility-administered medications for this visit.   Review of Systems  Review of Systems  Constitutional: Negative for fever, chills and unexpected weight change.  HENT: Negative for congestion, hearing  loss, sore throat, trouble swallowing and voice change.  Eyes: Negative for visual disturbance.  Respiratory: Negative for cough and wheezing.  Cardiovascular: Positive for leg swelling. Negative for chest pain and palpitations.  Gastrointestinal: Negative for nausea, vomiting, abdominal pain, diarrhea, constipation, blood in stool, abdominal distention and anal bleeding.  Genitourinary: Negative for hematuria, vaginal bleeding and difficulty urinating.  Musculoskeletal: Negative for arthralgias.  Skin: Negative for rash and wound.  Neurological: Negative for seizures, syncope and headaches.  Hematological: Negative for adenopathy. Does not bruise/bleed easily.  Psychiatric/Behavioral: Negative for confusion.  Blood pressure 122/68, pulse 68, resp. rate 16, height 5' 4.5" (1.638 m), weight 157 lb 3.2 oz (71.305 kg), last menstrual period 05/24/2013.  Physical Exam  Physical Exam  Constitutional: She is oriented to person, place, and time. She appears well-developed and well-nourished. No distress.  HENT:  Head: Normocephalic and atraumatic.  Right Ear: External ear  normal.  Left Ear: External ear normal.  Nose: Nose normal.  Mouth/Throat: Oropharynx is clear and moist. No oropharyngeal exudate.  Eyes: Conjunctivae are normal. Pupils are equal, round, and reactive to light. Right eye exhibits no discharge. Left eye exhibits no discharge. No scleral icterus.  Neck: Neck supple. No tracheal deviation present. No thyromegaly present.  Cardiovascular: Normal rate, regular rhythm, normal heart sounds and intact distal pulses.  No murmur heard.  Pulmonary/Chest: Effort normal and breath sounds normal. No respiratory distress. She has no wheezes.  Abdominal: Soft. Bowel sounds are normal. She exhibits no distension. There is no tenderness.  Musculoskeletal: Normal range of motion. She exhibits edema and tenderness.  There is tenderness and swelling of her right knee. Range of motion seems normal.  She is able to bear weight  Lymphadenopathy:  She has no cervical adenopathy.  Neurological: She is alert and oriented to person, place, and time.  Skin: Skin is warm and dry. She is not diaphoretic.  There is a 4 cm mass on her left lower back near the midline which is soft and mobile. There are no skin changes  Psychiatric: Her behavior is normal. Judgment normal.  Data Reviewed  I reviewed the notes from her primary care physician   Assessment   left back mass   Plan   I suspect this is a benign cyst or lipoma. It is getting larger so removal was recommended. I discussed the risk of surgery which includes but is not limited to bleeding, infection, having a chronic open wound, recurrence, et Ronney Asters. She understands and wishes to proceed with surgery

## 2014-04-03 ENCOUNTER — Encounter (HOSPITAL_COMMUNITY)
Admission: RE | Disposition: A | Discharge status: Home / Self Care | Payer: Self-pay | Source: Ambulatory Visit | Attending: Surgery

## 2014-04-03 ENCOUNTER — Ambulatory Visit (HOSPITAL_COMMUNITY): Payer: BC Managed Care – PPO | Admitting: Anesthesiology

## 2014-04-03 ENCOUNTER — Ambulatory Visit (HOSPITAL_COMMUNITY)
Admission: RE | Admit: 2014-04-03 | Discharge: 2014-04-03 | Disposition: A | Discharge status: Home / Self Care | Payer: BC Managed Care – PPO | Source: Ambulatory Visit | Attending: Surgery | Admitting: Surgery

## 2014-04-03 ENCOUNTER — Encounter (HOSPITAL_COMMUNITY): Payer: BC Managed Care – PPO | Admitting: Vascular Surgery

## 2014-04-03 ENCOUNTER — Encounter (HOSPITAL_COMMUNITY): Payer: Self-pay | Admitting: Certified Registered Nurse Anesthetist

## 2014-04-03 DIAGNOSIS — K219 Gastro-esophageal reflux disease without esophagitis: Secondary | ICD-10-CM | POA: Diagnosis not present

## 2014-04-03 DIAGNOSIS — F17200 Nicotine dependence, unspecified, uncomplicated: Secondary | ICD-10-CM | POA: Insufficient documentation

## 2014-04-03 DIAGNOSIS — L723 Sebaceous cyst: Secondary | ICD-10-CM | POA: Diagnosis present

## 2014-04-03 DIAGNOSIS — E119 Type 2 diabetes mellitus without complications: Secondary | ICD-10-CM | POA: Insufficient documentation

## 2014-04-03 DIAGNOSIS — E0590 Thyrotoxicosis, unspecified without thyrotoxic crisis or storm: Secondary | ICD-10-CM | POA: Insufficient documentation

## 2014-04-03 DIAGNOSIS — Z79899 Other long term (current) drug therapy: Secondary | ICD-10-CM | POA: Diagnosis not present

## 2014-04-03 HISTORY — PX: MASS EXCISION: SHX2000

## 2014-04-03 LAB — GLUCOSE, CAPILLARY
Glucose-Capillary: 93 mg/dL (ref 70–99)
Glucose-Capillary: 82 mg/dL (ref 70–99)

## 2014-04-03 SURGERY — EXCISION MASS
Anesthesia: Monitor Anesthesia Care | Site: Back | Laterality: Right

## 2014-04-03 MED ORDER — PROPOFOL 10 MG/ML IV BOLUS
INTRAVENOUS | Status: AC
  Filled 2014-04-03: qty 20

## 2014-04-03 MED ORDER — ACETAMINOPHEN 160 MG/5ML PO SOLN
325.0000 mg | ORAL | Status: DC | PRN
  Filled 2014-04-03: qty 20.3

## 2014-04-03 MED ORDER — PROPOFOL INFUSION 10 MG/ML OPTIME
INTRAVENOUS | Status: DC | PRN
  Administered 2014-04-03: 50 ug/kg/min via INTRAVENOUS

## 2014-04-03 MED ORDER — 0.9 % SODIUM CHLORIDE (POUR BTL) OPTIME
TOPICAL | Status: DC | PRN
  Administered 2014-04-03: 1000 mL

## 2014-04-03 MED ORDER — OXYCODONE HCL 5 MG/5ML PO SOLN: 5.0000 mg | Freq: Once | ORAL | Status: DC | PRN

## 2014-04-03 MED ORDER — ARTIFICIAL TEARS OP OINT
TOPICAL_OINTMENT | OPHTHALMIC | Status: AC
Start: 2014-04-03 — End: 2014-04-03
  Filled 2014-04-03: qty 3.5

## 2014-04-03 MED ORDER — MIDAZOLAM HCL 2 MG/2ML IJ SOLN
INTRAMUSCULAR | Status: AC
  Filled 2014-04-03: qty 2

## 2014-04-03 MED ORDER — BUPIVACAINE-EPINEPHRINE 0.25% -1:200000 IJ SOLN
INTRAMUSCULAR | Status: DC | PRN
  Administered 2014-04-03: 10 mL

## 2014-04-03 MED ORDER — PROMETHAZINE HCL 25 MG/ML IJ SOLN: 6.2500 mg | INTRAMUSCULAR | Status: DC | PRN

## 2014-04-03 MED ORDER — LACTATED RINGERS IV SOLN
INTRAVENOUS | Status: DC
  Administered 2014-04-03: 10:00:00 via INTRAVENOUS

## 2014-04-03 MED ORDER — LIDOCAINE HCL (CARDIAC) 20 MG/ML IV SOLN
INTRAVENOUS | Status: AC
  Filled 2014-04-03: qty 5

## 2014-04-03 MED ORDER — MIDAZOLAM HCL 5 MG/5ML IJ SOLN
INTRAMUSCULAR | Status: DC | PRN
  Administered 2014-04-03: 2 mg via INTRAVENOUS

## 2014-04-03 MED ORDER — LIDOCAINE HCL 1 % IJ SOLN
INTRAMUSCULAR | Status: DC | PRN
  Administered 2014-04-03: 10 mL

## 2014-04-03 MED ORDER — ONDANSETRON HCL 4 MG/2ML IJ SOLN
INTRAMUSCULAR | Status: DC | PRN
  Administered 2014-04-03: 4 mg via INTRAVENOUS

## 2014-04-03 MED ORDER — FENTANYL CITRATE 0.05 MG/ML IJ SOLN
INTRAMUSCULAR | Status: DC | PRN
  Administered 2014-04-03: 25 ug via INTRAVENOUS
  Administered 2014-04-03: 100 ug via INTRAVENOUS
  Administered 2014-04-03: 25 ug via INTRAVENOUS

## 2014-04-03 MED ORDER — OXYCODONE HCL 5 MG PO TABS: 5.0000 mg | ORAL_TABLET | Freq: Once | ORAL | Status: DC | PRN

## 2014-04-03 MED ORDER — TRAMADOL HCL 50 MG PO TABS: 50.0000 mg | ORAL_TABLET | Freq: Four times a day (QID) | ORAL | Status: DC | PRN

## 2014-04-03 MED ORDER — FENTANYL CITRATE 0.05 MG/ML IJ SOLN: 25.0000 ug | INTRAMUSCULAR | Status: DC | PRN

## 2014-04-03 MED ORDER — ONDANSETRON HCL 4 MG/2ML IJ SOLN
INTRAMUSCULAR | Status: AC
  Filled 2014-04-03: qty 2

## 2014-04-03 MED ORDER — FENTANYL CITRATE 0.05 MG/ML IJ SOLN
INTRAMUSCULAR | Status: AC
  Filled 2014-04-03: qty 5

## 2014-04-03 MED ORDER — ACETAMINOPHEN 325 MG PO TABS: 325.0000 mg | ORAL_TABLET | ORAL | Status: DC | PRN

## 2014-04-03 MED ORDER — KETOROLAC TROMETHAMINE 30 MG/ML IJ SOLN
INTRAMUSCULAR | Status: AC
  Filled 2014-04-03: qty 1

## 2014-04-03 MED ORDER — KETOROLAC TROMETHAMINE 30 MG/ML IJ SOLN
INTRAMUSCULAR | Status: DC | PRN
  Administered 2014-04-03: 30 mg via INTRAVENOUS

## 2014-04-03 MED ORDER — PROPOFOL 10 MG/ML IV BOLUS
INTRAVENOUS | Status: DC | PRN
  Administered 2014-04-03: 40 mg via INTRAVENOUS
  Administered 2014-04-03: 30 mg via INTRAVENOUS

## 2014-04-03 MED ORDER — LIDOCAINE HCL (PF) 1 % IJ SOLN
INTRAMUSCULAR | Status: AC
  Filled 2014-04-03: qty 30

## 2014-04-03 SURGICAL SUPPLY — 46 items
APL SKNCLS STERI-STRIP NONHPOA (GAUZE/BANDAGES/DRESSINGS) ×1
BENZOIN TINCTURE PRP APPL 2/3 (GAUZE/BANDAGES/DRESSINGS) ×2 IMPLANT
CANISTER SUCTION 2500CC (MISCELLANEOUS) ×1 IMPLANT
COVER SURGICAL LIGHT HANDLE (MISCELLANEOUS) ×2 IMPLANT
DECANTER SPIKE VIAL GLASS SM (MISCELLANEOUS) ×1 IMPLANT
DRAPE LAPAROSCOPIC ABDOMINAL (DRAPES) IMPLANT
DRAPE PED LAPAROTOMY (DRAPES) ×1 IMPLANT
DRAPE UTILITY 15X26 W/TAPE STR (DRAPE) ×4 IMPLANT
DRSG TEGADERM 2-3/8X2-3/4 SM (GAUZE/BANDAGES/DRESSINGS) ×1 IMPLANT
ELECT CAUTERY BLADE 6.4 (BLADE) ×2 IMPLANT
ELECT REM PT RETURN 9FT ADLT (ELECTROSURGICAL) ×2
ELECTRODE REM PT RTRN 9FT ADLT (ELECTROSURGICAL) ×1 IMPLANT
GAUZE SPONGE 2X2 8PLY STRL LF (GAUZE/BANDAGES/DRESSINGS) IMPLANT
GLOVE BIO SURGEON STRL SZ7.5 (GLOVE) ×1 IMPLANT
GLOVE BIOGEL PI IND STRL 6.5 (GLOVE) IMPLANT
GLOVE BIOGEL PI IND STRL 7.5 (GLOVE) IMPLANT
GLOVE BIOGEL PI INDICATOR 6.5 (GLOVE) ×1
GLOVE BIOGEL PI INDICATOR 7.5 (GLOVE) ×1
GLOVE SURG SIGNA 7.5 PF LTX (GLOVE) ×2 IMPLANT
GOWN STRL REUS W/ TWL LRG LVL3 (GOWN DISPOSABLE) ×1 IMPLANT
GOWN STRL REUS W/ TWL XL LVL3 (GOWN DISPOSABLE) ×1 IMPLANT
GOWN STRL REUS W/TWL LRG LVL3 (GOWN DISPOSABLE) ×2
GOWN STRL REUS W/TWL XL LVL3 (GOWN DISPOSABLE) ×2
KIT BASIN OR (CUSTOM PROCEDURE TRAY) ×2 IMPLANT
KIT ROOM TURNOVER OR (KITS) ×2 IMPLANT
NDL HYPO 25X1 1.5 SAFETY (NEEDLE) ×1 IMPLANT
NEEDLE HYPO 25X1 1.5 SAFETY (NEEDLE) ×2 IMPLANT
NS IRRIG 1000ML POUR BTL (IV SOLUTION) ×2 IMPLANT
PACK SURGICAL SETUP 50X90 (CUSTOM PROCEDURE TRAY) ×2 IMPLANT
PAD ARMBOARD 7.5X6 YLW CONV (MISCELLANEOUS) ×5 IMPLANT
PENCIL BUTTON HOLSTER BLD 10FT (ELECTRODE) ×2 IMPLANT
SPECIMEN JAR MEDIUM (MISCELLANEOUS) ×1 IMPLANT
SPECIMEN JAR SMALL (MISCELLANEOUS) ×1 IMPLANT
SPONGE GAUZE 2X2 STER 10/PKG (GAUZE/BANDAGES/DRESSINGS) ×1
SPONGE GAUZE 4X4 12PLY (GAUZE/BANDAGES/DRESSINGS) IMPLANT
SPONGE LAP 18X18 X RAY DECT (DISPOSABLE) ×2 IMPLANT
STRIP CLOSURE SKIN 1/2X4 (GAUZE/BANDAGES/DRESSINGS) ×2 IMPLANT
SUT MNCRL AB 4-0 PS2 18 (SUTURE) ×2 IMPLANT
SUT VIC AB 3-0 SH 27 (SUTURE) ×2
SUT VIC AB 3-0 SH 27XBRD (SUTURE) ×1 IMPLANT
SYR BULB 3OZ (MISCELLANEOUS) ×2 IMPLANT
SYR CONTROL 10ML LL (SYRINGE) ×2 IMPLANT
TOWEL OR 17X24 6PK STRL BLUE (TOWEL DISPOSABLE) ×2 IMPLANT
TOWEL OR 17X26 10 PK STRL BLUE (TOWEL DISPOSABLE) ×2 IMPLANT
TUBE CONNECTING 12X1/4 (SUCTIONS) ×1 IMPLANT
YANKAUER SUCT BULB TIP NO VENT (SUCTIONS) ×1 IMPLANT

## 2014-04-03 NOTE — Op Note (Signed)
NAMEJACOLE, KINDERKNECHT               ACCOUNT NO.:  [id number]  MEDICAL RECORD NO.:  [id number]  LOCATION:  [facility]                         FACILITY:  [facility]  PHYSICIAN:  Abigail Miyamoto, M.D. DATE OF BIRTH:  29-Sep-1966  DATE OF PROCEDURE:  04/03/2014 DATE OF DISCHARGE:  04/03/2014                              OPERATIVE REPORT   PREOPERATIVE DIAGNOSIS:  Right lower back mass.  POSTOPERATIVE DIAGNOSIS:  Right lower back sebaceous cyst.  PROCEDURE:  Excision of right lower back mass (4 cm).  SURGEON:  Abigail Miyamoto, M.D.  ANESTHESIA:  1% lidocaine, 0.5% Marcaine, and monitored anesthesia care.  ESTIMATED BLOOD LOSS:  Minimal.  PROCEDURE IN DETAIL:  The patient was brought to the operating room, identified as Alexandra Randall.  She was placed supine on the operating room table and anesthesia was induced.  The patient was placed in left lateral decubitus position.  Her right lower back was then prepped and draped in usual sterile fashion.  I anesthetized the skin with lidocaine and made a small longitudinal incision over top of the mass, this was taken down to the mass with electrocautery.  I then identified the mass, it was consistent with a sebaceous cyst.  I excised all the sebaceous debris and was able to remove the cyst capsule in its entirety.  This was sent to Pathology for evaluation.  I then irrigated the wound with saline.  I achieved hemostasis with cautery.  I anesthetized it further with Marcaine.  I then closed the subcutaneous tissue with interrupted 3- 0 Vicryl sutures and closed the skin with a running 4-0 Monocryl.  Steri- Strips, gauze, and Tegaderm were then applied.  The patient tolerated the procedure well.  All the counts were correct at the end of procedure.  The patient was then extubated in the operating room and taken in stable condition to recovery room.     Abigail Miyamoto, M.D.     DB/MEDQ  D:  04/03/2014  T:  04/03/2014  Job:  [id number]

## 2014-04-03 NOTE — Anesthesia Postprocedure Evaluation (Signed)
  Anesthesia Post-op Note  Patient: Alexandra Randall  Procedure(s) Performed: Procedure(s): EXCISION MASS RIGHT LOWER BACK (Right)  Patient Location: PACU  Anesthesia Type:MAC  Level of Consciousness: awake and alert   Airway and Oxygen Therapy: Patient Spontanous Breathing  Post-op Pain: none  Post-op Assessment: Post-op Vital signs reviewed, Patient's Cardiovascular Status Stable, Respiratory Function Stable, Patent Airway, No signs of Nausea or vomiting and Pain level controlled  Post-op Vital Signs: Reviewed and stable  Last Vitals:  Filed Vitals:   04/03/14 1145  BP: 153/84  Pulse: 55  Temp:   Resp: 15    Complications: No apparent anesthesia complications

## 2014-04-03 NOTE — H&P (Signed)
Chief Complaint   Patient presents with   .  New Evaluation     eval seb cyst on back   HPI  Alexandra Randall is a 48 y.o. female.  HPI  This is a 48 year old female referred by Dr. Jenna Luo for evaluation of a mass on her back. It has been present for several years but is now becoming larger. It has never drained. It has never become erythematous but does fall some mild discomfort. She actually reports maybe she also just recently fell in her bathroom and landed on her right knee and since then has had swelling and pain in her right lower extremity.  Past Medical History   Diagnosis  Date   .  GERD (gastroesophageal reflux disease)    .  Eczema    .  Hyperlipidemia    .  Graves disease    .  Diabetes mellitus without complication    .  Heart murmur     Past Surgical History   Procedure  Laterality  Date   .  Cholecystectomy     .  Ovarian cyst removal      Family History   Problem  Relation  Age of Onset   .  Cancer  Mother  71     throat   .  Cancer  Father  48     lung   Social History  History   Substance Use Topics   .  Smoking status:  Current Every Day Smoker     Types:  Cigarettes   .  Smokeless tobacco:  Never Used   .  Alcohol Use:  Yes      Comment: Occasion    Allergies   Allergen  Reactions   .  Hydrocodone  Other (See Comments)     Heart palpitations    Current Outpatient Prescriptions   Medication  Sig  Dispense  Refill   .  Blood Glucose Monitoring Suppl (ONE TOUCH ULTRA MINI) W/DEVICE KIT      .  Choline Fenofibrate 135 MG capsule  Take 1 capsule (135 mg total) by mouth daily.  30 capsule  5   .  EASY TOUCH LANCETS 30G/TWIST MISC      .  Lancet Devices (EASY TOUCH LANCING DEVICE) MISC      .  ONE TOUCH ULTRA TEST test strip       No current facility-administered medications for this visit.   Review of Systems  Review of Systems  Constitutional: Negative for fever, chills and unexpected weight change.  HENT: Negative for congestion, hearing  loss, sore throat, trouble swallowing and voice change.  Eyes: Negative for visual disturbance.  Respiratory: Negative for cough and wheezing.  Cardiovascular: Positive for leg swelling. Negative for chest pain and palpitations.  Gastrointestinal: Negative for nausea, vomiting, abdominal pain, diarrhea, constipation, blood in stool, abdominal distention and anal bleeding.  Genitourinary: Negative for hematuria, vaginal bleeding and difficulty urinating.  Musculoskeletal: Negative for arthralgias.  Skin: Negative for rash and wound.  Neurological: Negative for seizures, syncope and headaches.  Hematological: Negative for adenopathy. Does not bruise/bleed easily.  Psychiatric/Behavioral: Negative for confusion.  Blood pressure 122/68, pulse 68, resp. rate 16, height 5' 4.5" (1.638 m), weight 157 lb 3.2 oz (71.305 kg), last menstrual period 05/24/2013.  Physical Exam  Physical Exam  Constitutional: She is oriented to person, place, and time. She appears well-developed and well-nourished. No distress.  HENT:  Head: Normocephalic and atraumatic.  Right Ear: External ear  normal.  Left Ear: External ear normal.  Nose: Nose normal.  Mouth/Throat: Oropharynx is clear and moist. No oropharyngeal exudate.  Eyes: Conjunctivae are normal. Pupils are equal, round, and reactive to light. Right eye exhibits no discharge. Left eye exhibits no discharge. No scleral icterus.  Neck: Neck supple. No tracheal deviation present. No thyromegaly present.  Cardiovascular: Normal rate, regular rhythm, normal heart sounds and intact distal pulses.  No murmur heard.  Pulmonary/Chest: Effort normal and breath sounds normal. No respiratory distress. She has no wheezes.  Abdominal: Soft. Bowel sounds are normal. She exhibits no distension. There is no tenderness.  Musculoskeletal: Normal range of motion. She exhibits edema and tenderness.  There is tenderness and swelling of her right knee. Range of motion seems normal.  She is able to bear weight  Lymphadenopathy:  She has no cervical adenopathy.  Neurological: She is alert and oriented to person, place, and time.  Skin: Skin is warm and dry. She is not diaphoretic.  There is a 4 cm mass on her right lower back near the midline which is soft and mobile. There are no skin changes  Psychiatric: Her behavior is normal. Judgment normal.  Data Reviewed  I reviewed the notes from her primary care physician   Assessment  Right lower back mass   Plan  I suspect this is a benign cyst or lipoma. It is getting larger so removal was recommended. I discussed the risk of surgery which includes but is not limited to bleeding, infection, having a chronic open wound, recurrence, et Ronney Asters. She understands and wishes to proceed with surgery

## 2014-04-03 NOTE — Discharge Instructions (Signed)
Ok to shower starting tomorrow  Leave steri-strips on at least one week  What to eat:  For your first meals, you should eat lightly; only small meals initially.  If you do not have nausea, you may eat larger meals.  Avoid spicy, greasy and heavy food.    General Anesthesia, Adult, Care After  Refer to this sheet in the next few weeks. These instructions provide you with information on caring for yourself after your procedure. Your health care provider may also give you more specific instructions. Your treatment has been planned according to current medical practices, but problems sometimes occur. Call your health care provider if you have any problems or questions after your procedure.  WHAT TO EXPECT AFTER THE PROCEDURE  After the procedure, it is typical to experience:  Sleepiness.  Nausea and vomiting. HOME CARE INSTRUCTIONS  For the first 24 hours after general anesthesia:  Have a responsible person with you.  Do not drive a car. If you are alone, do not take public transportation.  Do not drink alcohol.  Do not take medicine that has not been prescribed by your health care provider.  Do not sign important papers or make important decisions.  You may resume a normal diet and activities as directed by your health care provider.  Change bandages (dressings) as directed.  If you have questions or problems that seem related to general anesthesia, call the hospital and ask for the anesthetist or anesthesiologist on call. SEEK MEDICAL CARE IF:  You have nausea and vomiting that continue the day after anesthesia.  You develop a rash. SEEK IMMEDIATE MEDICAL CARE IF:  You have difficulty breathing.  You have chest pain.  You have any allergic problems. Document Released: 01/18/2001 Document Revised: 06/14/2013 Document Reviewed: 04/27/2013  [employer] Patient Information 2014 [place], [place].

## 2014-04-03 NOTE — Op Note (Signed)
EXCISION MASS RIGHT LOWER BACK  Procedure Note  Alexandra Randall 04/03/2014   Pre-op Diagnosis: RIGHT LOWER BACK MASS     Post-op Diagnosis: RIGHT LOWER BACK SEBACEOUS CYST  Procedure(s): EXCISION MASS RIGHT LOWER BACK (4 CM)   Surgeon(s): Shelly Rubenstein, MD  Anesthesia: Choice  Staff:  Circulator: Megan Day Cavanaugh, RN Scrub Person: Denese Killings, CST Circulator Assistant: Lina Sayre, RN; Hortencia Pilar, RN  Estimated Blood Loss: Minimal               Specimens: SENT TO PATH          Shelly Rubenstein   Date: 04/03/2014  Time: 11:04 AM

## 2014-04-03 NOTE — Interval H&P Note (Signed)
History and Physical Interval Note: NO CHANGE IN H AND P  04/03/2014 10:07 AM  Alexandra Randall  has presented today for surgery, with the diagnosis of lower back mass  The various methods of treatment have been discussed with the patient and family. After consideration of risks, benefits and other options for treatment, the patient has consented to  Procedure(s): EXCISION MASS (N/A) as a surgical intervention .  The patient's history has been reviewed, patient examined, no change in status, stable for surgery.  I have reviewed the patient's chart and labs.  Questions were answered to the patient's satisfaction.     Shelly Rubenstein

## 2014-04-03 NOTE — Progress Notes (Signed)
Called Dr.Moser for sign out. 

## 2014-04-03 NOTE — Transfer of Care (Signed)
Immediate Anesthesia Transfer of Care Note  Patient: Alexandra Randall  Procedure(s) Performed: Procedure(s): EXCISION MASS RIGHT LOWER BACK (Right)  Patient Location: PACU  Anesthesia Type:MAC  Level of Consciousness: awake, alert  and oriented  Airway & Oxygen Therapy: Patient Spontanous Breathing  Post-op Assessment: Report given to PACU RN and Post -op Vital signs reviewed and stable  Post vital signs: Reviewed and stable  Complications: No apparent anesthesia complications

## 2014-04-03 NOTE — Anesthesia Preprocedure Evaluation (Addendum)
Anesthesia Evaluation  Patient identified by MRN, date of birth, ID band Patient awake    Reviewed: Allergy & Precautions, H&P , NPO status , Patient's Chart, lab work & pertinent test results  History of Anesthesia Complications Negative for: history of anesthetic complications  Airway Mallampati: I TM Distance: >3 FB Neck ROM: Full    Dental  (+) Teeth Intact, Dental Advisory Given   Pulmonary Current Smoker,    Pulmonary exam normal       Cardiovascular negative cardio ROS  Rhythm:Regular     Neuro/Psych negative neurological ROS  negative psych ROS   GI/Hepatic Neg liver ROS, GERD-  Medicated and Controlled,  Endo/Other  diabetes, Well Controlled, Type 2Hyperthyroidism   Renal/GU negative Renal ROS     Musculoskeletal   Abdominal   Peds  Hematology   Anesthesia Other Findings   Reproductive/Obstetrics                          Anesthesia Physical Anesthesia Plan  ASA: II  Anesthesia Plan: MAC   Post-op Pain Management:    Induction: Intravenous  Airway Management Planned: Nasal Cannula  Additional Equipment: None  Intra-op Plan:   Post-operative Plan:   Informed Consent: I have reviewed the patients History and Physical, chart, labs and discussed the procedure including the risks, benefits and alternatives for the proposed anesthesia with the patient or authorized representative who has indicated his/her understanding and acceptance.   Dental advisory given  Plan Discussed with: Anesthesiologist, Surgeon and CRNA  Anesthesia Plan Comments:        Anesthesia Quick Evaluation

## 2014-04-03 NOTE — Interval H&P Note (Signed)
History and Physical Interval Note: the mass is actually on the right lower back and not the left as stated on the H and P.  It has been circled and the patient agrees with the location  04/03/2014 10:25 AM  Skeet Latch  has presented today for surgery, with the diagnosis of RIGHT LOWER BACK MASS  The various methods of treatment have been discussed with the patient and family. After consideration of risks, benefits and other options for treatment, the patient has consented to  Procedure(s): EXCISION MASS RIGHT LOWER BACK (Right) as a surgical intervention .  The patient's history has been reviewed, patient examined, no change in status, stable for surgery.  I have reviewed the patient's chart and labs.  Questions were answered to the patient's satisfaction.     Shelly Rubenstein

## 2014-04-05 ENCOUNTER — Encounter (HOSPITAL_COMMUNITY): Payer: Self-pay | Admitting: Surgery

## 2014-04-13 ENCOUNTER — Telehealth (INDEPENDENT_AMBULATORY_CARE_PROVIDER_SITE_OTHER): Payer: Self-pay

## 2014-04-13 NOTE — Telephone Encounter (Signed)
Pt s/p excision right mass on 04/03/14. Pt states that yesterday she noticed while at work the area started bleeding. Pt states that she was able to stop the bleeding and covered the area with a bandage. Pt denies any redness, swelling or tenderness. Advised the patient to keep the area covered while working if area is open due to infection risk. Advised pt to watch for signs of infection such as redness, swelling, tenderness or new drainage. Advised pt to call us back if these things arise. Pt verbalized understanding and agrees with POC.

## 2014-05-04 ENCOUNTER — Encounter (INDEPENDENT_AMBULATORY_CARE_PROVIDER_SITE_OTHER): Payer: BC Managed Care – PPO | Admitting: Surgery

## 2014-05-14 ENCOUNTER — Encounter (INDEPENDENT_AMBULATORY_CARE_PROVIDER_SITE_OTHER): Payer: Self-pay | Admitting: General Surgery

## 2014-05-14 ENCOUNTER — Telehealth (INDEPENDENT_AMBULATORY_CARE_PROVIDER_SITE_OTHER): Payer: Self-pay

## 2014-05-14 ENCOUNTER — Ambulatory Visit (INDEPENDENT_AMBULATORY_CARE_PROVIDER_SITE_OTHER): Payer: BC Managed Care – PPO | Admitting: General Surgery

## 2014-05-14 ENCOUNTER — Encounter (INDEPENDENT_AMBULATORY_CARE_PROVIDER_SITE_OTHER): Payer: Self-pay | Admitting: Surgery

## 2014-05-14 VITALS — BP 140/82 | HR 71 | Temp 98.3°F | Resp 16 | Ht 64.5 in | Wt 165.8 lb

## 2014-05-14 DIAGNOSIS — Z09 Encounter for follow-up examination after completed treatment for conditions other than malignant neoplasm: Secondary | ICD-10-CM

## 2014-05-14 NOTE — Telephone Encounter (Signed)
Pt s/p excision right mass on 04/03/14. Pt states that yesterday she noticed while at work the area started draining pus and dark "blood". Pt states that every time that she touches the wound it starts draining more.  Pt denies any redness, swelling or tenderness. Pt denies any any fevers or chills. Advised the patient to keep the area covered while working if area is open due to infection risk. Pt was given a urgent office appt. Pt verbalized understanding.

## 2014-05-15 NOTE — Progress Notes (Signed)
History: Patient comes to the urgent office, one month status post excision of large sebaceous cyst from her  lower back by Dr. Rayburn MaBlackmon. She noticed 2 or 3 days ago drainage of brown in pain and foul-smelling material from the incision.  Exam: BP 140/82  Pulse 71  Temp(Src) 98.3 F (36.8 C) (Temporal)  Resp 16  Ht 5' 4.5" (1.638 m)  Wt 165 lb 12.8 oz (75.206 kg)  BMI 28.03 kg/m2 There is a scar on the lower back with a central several millimeter opening. I am able to express a moderate amount of thick sebaceous material. The wound was opened slightly and cleaned with a cotton-tipped applicator and packed with iodoform gauze.  Assessment and plan: Drainage of sebaceous cyst excision. It appears there is some residual sac or sebaceous material. She will remove the packing tomorrow and then cleaned and the showering keep covered. We will get her back to see Dr. Magnus IvanBlackman in a week or 2. The area could need reexcision.

## 2014-05-22 ENCOUNTER — Encounter (INDEPENDENT_AMBULATORY_CARE_PROVIDER_SITE_OTHER): Payer: Self-pay | Admitting: Surgery

## 2014-05-22 ENCOUNTER — Ambulatory Visit (INDEPENDENT_AMBULATORY_CARE_PROVIDER_SITE_OTHER): Payer: BC Managed Care – PPO | Admitting: Surgery

## 2014-05-22 VITALS — BP 114/67 | HR 75 | Temp 97.2°F | Ht 64.0 in | Wt 167.0 lb

## 2014-05-22 DIAGNOSIS — Z09 Encounter for follow-up examination after completed treatment for conditions other than malignant neoplasm: Secondary | ICD-10-CM

## 2014-05-22 MED ORDER — DOXYCYCLINE HYCLATE 100 MG PO TABS: 100.0000 mg | ORAL_TABLET | Freq: Two times a day (BID) | ORAL | Status: DC

## 2014-05-22 NOTE — Progress Notes (Signed)
Subjective:     Patient ID: Alexandra LatchWendy R Randall, female   DOB: 1965/11/15, 48 y.o.   MRN: [id number]  HPI She is here for a followup visit. She has been having intermittent drainage from the back incision. Again, at the time of surgery she was found to have a sebaceous cyst and not a lipoma as expected  Review of Systems     Objective:   Physical Exam On exam, the wound is and is completely healed except for a small area draining purulence. There is no sebaceous debris. There is no surrounding erythema    Assessment:     Possible recurrent sebaceous cyst     Plan:     I am going to place her on antibiotics. Hopefully this will resolve without need for further surgery. I will see her back in 3 weeks

## 2014-06-11 ENCOUNTER — Encounter (INDEPENDENT_AMBULATORY_CARE_PROVIDER_SITE_OTHER): Payer: BC Managed Care – PPO | Admitting: Surgery

## 2014-06-25 ENCOUNTER — Ambulatory Visit (INDEPENDENT_AMBULATORY_CARE_PROVIDER_SITE_OTHER): Payer: BC Managed Care – PPO | Admitting: Surgery

## 2014-06-25 VITALS — BP 110/80 | HR 74 | Temp 98.7°F | Ht 64.0 in | Wt 170.5 lb

## 2014-06-25 DIAGNOSIS — Z09 Encounter for follow-up examination after completed treatment for conditions other than malignant neoplasm: Secondary | ICD-10-CM

## 2014-06-25 NOTE — Progress Notes (Signed)
Subjective:     Patient ID: Alexandra Randall, female   DOB: 13-Dec-1965, 48 y.o.   MRN: [id number]  HPI She is here for another postop visit. She reports no drainage from her incision and that she is doing well  Review of Systems     Objective:   Physical Exam On exam, the incision is completely healed without evidence of infection or recurrence    Assessment:     Patient stable postop     Plan:     She may resume normal activity. I will see her back as needed

## 2014-08-09 ENCOUNTER — Other Ambulatory Visit: Payer: Self-pay | Admitting: Family Medicine

## 2014-11-13 ENCOUNTER — Other Ambulatory Visit (HOSPITAL_COMMUNITY): Payer: Self-pay | Admitting: Physician Assistant

## 2014-11-13 DIAGNOSIS — R0989 Other specified symptoms and signs involving the circulatory and respiratory systems: Secondary | ICD-10-CM

## 2014-11-22 ENCOUNTER — Ambulatory Visit (HOSPITAL_COMMUNITY)
Admission: RE | Admit: 2014-11-22 | Discharge: 2014-11-22 | Disposition: A | Discharge status: Home / Self Care | Payer: BLUE CROSS/BLUE SHIELD | Source: Ambulatory Visit | Attending: Cardiovascular Disease | Admitting: Cardiovascular Disease

## 2014-11-22 DIAGNOSIS — R0989 Other specified symptoms and signs involving the circulatory and respiratory systems: Secondary | ICD-10-CM

## 2014-11-22 DIAGNOSIS — I6523 Occlusion and stenosis of bilateral carotid arteries: Secondary | ICD-10-CM | POA: Insufficient documentation

## 2014-11-22 NOTE — Progress Notes (Signed)
Carotid Duplex Completed. °Brianna L Mazza,RVT °

## 2014-12-19 ENCOUNTER — Telehealth: Payer: Self-pay | Admitting: Internal Medicine

## 2014-12-19 NOTE — Telephone Encounter (Signed)
[facility] for appointment with Dr Rennis GoldenHilty on 01/07/15.  [facility] Dr Blanchie DessertHilty's schedule on 01/07/15.  lp

## 2015-01-07 ENCOUNTER — Ambulatory Visit: Payer: BLUE CROSS/BLUE SHIELD | Admitting: Internal Medicine

## 2015-12-24 ENCOUNTER — Encounter: Payer: Self-pay | Admitting: Family Medicine

## 2015-12-24 ENCOUNTER — Ambulatory Visit (INDEPENDENT_AMBULATORY_CARE_PROVIDER_SITE_OTHER): Payer: BLUE CROSS/BLUE SHIELD | Admitting: Family Medicine

## 2015-12-24 VITALS — BP 98/58 | HR 72 | Temp 98.4°F | Resp 18 | Ht 64.0 in | Wt 164.0 lb

## 2015-12-24 DIAGNOSIS — R739 Hyperglycemia, unspecified: Secondary | ICD-10-CM | POA: Diagnosis not present

## 2015-12-24 DIAGNOSIS — G44209 Tension-type headache, unspecified, not intractable: Secondary | ICD-10-CM

## 2015-12-24 DIAGNOSIS — R5383 Other fatigue: Secondary | ICD-10-CM | POA: Diagnosis not present

## 2015-12-24 LAB — GLUCOSE, FINGERSTICK (STAT): Glucose, fingerstick: 105 mg/dL — ABNORMAL HIGH (ref 70–99)

## 2015-12-24 MED ORDER — DIAZEPAM 5 MG PO TABS: 5.0000 mg | ORAL_TABLET | Freq: Three times a day (TID) | ORAL | Status: DC | PRN

## 2015-12-24 NOTE — Progress Notes (Signed)
Subjective:    Patient ID: Alexandra Randall, female    DOB: 1966/04/09, 50 y.o.   MRN: [id number]  HPI 06/2013 Patient is a 50 year old [place] American female who is here today for complete physical exam. She continues to smoke and is not ready to quit. She has a remote history of Graves' disease. She is on no medication she is overdue to recheck a TSH. She also has a history of hyperlipidemia. She has been off Lipitor for over 7 months and is overdue to recheck a fasting lipid panel. She declined her flu shot today. She is overdue for a tetanus vaccine and does request that today. Her mammogram is due later this year and she normally schedules that.  Her blood pressure is elevated today. It was also elevated recently in urgent care where she was treated for sinus infection.  She does not have a previous history of hypertension.  At that time, my plan was: 1. Routine general medical examination at a health care facility Patient declines a flu shot. She was given a tetanus shot today. She will schedule her mammogram. Pap smear sent today and I will await results. Check a CBC fasting lipid panel TSH and CMP. Her blood pressure is elevated today. I asked her to check it everyday at home for the next week and membrane in values in one week. If her blood pressures greater than 140/90 he'll recommend medication. Also recommended low sodium diet. Also recommended smoking cessation. Patient's breast exam is normal - PAP, Thin Prep w/HPV rflx HPV Type 16/18 - COMPLETE METABOLIC PANEL WITH GFR - CBC with Differential - Lipid panel - TSH  2. Need for prophylactic vaccination and inoculation against unspecified single disease - Tdap vaccine greater than or equal to 7yo IM  12/24/15 Patient has not been seen since 06/2013.  Over the last 6 days she reports a constant pressure-like pain behind both eyes beneath the nasal bridge and in her cheeks. It is a constant pressure-like pain. She denies any fevers or chills  or rhinorrhea. She denies any cough or sore throat or flulike symptoms. She does report insomnia as she has been having more trouble sleeping recently which may be contributing to a tension headache. She denies any photophobia or phonophobia. She went to the urgent care this weekend due to the headache and was given some Advil. At that time she states that her blood sugar was 199. This is a random blood sugar. Per the patient she has diabetes although I have no documentation of this. She also complains of generalized fatigue and weakness recently. Past Medical History  Diagnosis Date  . GERD (gastroesophageal reflux disease)   . Eczema   . Hyperlipidemia   . Graves disease   . Diabetes mellitus without complication   . Heart murmur    Current Outpatient Prescriptions on File Prior to Visit  Medication Sig Dispense Refill  . acetaminophen (TYLENOL) 500 MG tablet Take 500 mg by mouth as needed.    Marland Kitchen aspirin EC 81 MG tablet Take 81 mg by mouth daily.    Marland Kitchen atorvastatin (LIPITOR) 40 MG tablet     . Blood Glucose Monitoring Suppl (ONE TOUCH ULTRA MINI) W/DEVICE KIT     . doxycycline (VIBRA-TABS) 100 MG tablet Take 1 tablet (100 mg total) by mouth 2 (two) times daily. 20 tablet 1  . EASY TOUCH LANCETS 30G/TWIST MISC     . Lancet Devices (EASY TOUCH LANCING DEVICE) MISC     . meloxicam (  MOBIC) 7.5 MG tablet Take 7.5 mg by mouth daily.    . naproxen sodium (ALEVE) 220 MG tablet Take 440 mg by mouth daily as needed (for cramps or leg pain).    . Omega-3 Fatty Acids (FISH OIL PO) Take 1 capsule by mouth 2 (two) times daily.    Marland Kitchen OVER THE COUNTER MEDICATION Take 1 tablet by mouth 2 (two) times daily. Niacin    . OVER THE COUNTER MEDICATION Take 1 tablet by mouth daily.    Marland Kitchen OVER THE COUNTER MEDICATION Apply 1 patch topically 3 (three) times a week. Pain patch    . ranitidine (ZANTAC) 150 MG tablet Take 150 mg by mouth as needed for heartburn.    . traMADol (ULTRAM) 50 MG tablet Take 1-2 tablets (50-100  mg total) by mouth every 6 (six) hours as needed. 40 tablet 0  . UNISTRIP1 GENERIC test strip TEST BLOOD SUGARS 3 TIMES DAILY 100 each PRN  . ZETIA 10 MG tablet Take 10 mg by mouth daily.      No current facility-administered medications on file prior to visit.   Allergies  Allergen Reactions  . Hydrocodone Other (See Comments)    Heart palpitations    Social History   Social History  . Marital Status: Divorced    Spouse Name: N/A  . Number of Children: N/A  . Years of Education: N/A   Occupational History  . Not on file.   Social History Main Topics  . Smoking status: Current Every Day Smoker -- 0.50 packs/day for 20 years    Types: Cigarettes  . Smokeless tobacco: Never Used  . Alcohol Use: Yes     Comment: Occasion  . Drug Use: No  . Sexual Activity: Yes    Birth Control/ Protection: Condom   Other Topics Concern  . Not on file   Social History Narrative  . No narrative on file   Family History  Problem Relation Age of Onset  . Cancer Mother 71    throat  . Cancer Father 2    lung      Review of Systems  All other systems reviewed and are negative.      Objective:   Physical Exam  Constitutional: She is oriented to person, place, and time. She appears well-developed and well-nourished. No distress.  HENT:  Head: Normocephalic and atraumatic.  Right Ear: External ear normal.  Left Ear: External ear normal.  Nose: Nose normal.  Mouth/Throat: Oropharynx is clear and moist. No oropharyngeal exudate.  Eyes: Conjunctivae and EOM are normal. Pupils are equal, round, and reactive to light. Right eye exhibits no discharge. Left eye exhibits no discharge. No scleral icterus.  Neck: Normal range of motion. Neck supple. No JVD present. No tracheal deviation present. No thyromegaly present.  Cardiovascular: Normal rate, regular rhythm and intact distal pulses.  Exam reveals no gallop and no friction rub.   Murmur heard. Pulmonary/Chest: Effort normal and  breath sounds normal. No stridor. No respiratory distress. She has no wheezes. She has no rales. She exhibits no tenderness.  Abdominal: Soft. Bowel sounds are normal. She exhibits no distension and no mass. There is no tenderness. There is no rebound and no guarding.  Genitourinary: Vagina normal and uterus normal. No vaginal discharge found.  Musculoskeletal: Normal range of motion. She exhibits no edema or tenderness.  Lymphadenopathy:    She has no cervical adenopathy.  Neurological: She is alert and oriented to person, place, and time. She has normal reflexes. No  cranial nerve deficit. She exhibits normal muscle tone. Coordination normal.  Skin: Skin is warm. No rash noted. She is not diaphoretic. No erythema. No pallor.  Psychiatric: She has a normal mood and affect. Her behavior is normal. Judgment and thought content normal.  Vitals reviewed.        Assessment & Plan:   Tension headache - Plan: diazepam (VALIUM) 5 MG tablet  Hyperglycemia - Plan: COMPLETE METABOLIC PANEL WITH GFR, CBC with Differential/Platelet, Hemoglobin A1c, Glucose, fingerstick (stat)  Other fatigue - Plan: COMPLETE METABOLIC PANEL WITH GFR, CBC with Differential/Platelet, TSH  I believe the patient is having a tension headache or sinus infection. I will treat a tension headache with Valium 5 mg every 8 hours as needed for headache. Hopefully this will help with the insomnia as well. If the headache is not improving by Friday, I would recommend empiric treatment for sinus infection with Augmentin. Given her recent hyperglycemia, I will check basic lab work including a CMP, CBC, and hemoglobin A1c. I will also check a TSH. Depending upon the lab results, we may discuss medication versus a low carbohydrate diet.

## 2015-12-25 LAB — COMPLETE METABOLIC PANEL WITH GFR
ALT: 15 U/L (ref 6–29)
AST: 16 U/L (ref 10–35)
Albumin: 4.1 g/dL (ref 3.6–5.1)
Alkaline Phosphatase: 65 U/L (ref 33–115)
BUN: 12 mg/dL (ref 7–25)
CO2: 28 mmol/L (ref 20–31)
Calcium: 9.3 mg/dL (ref 8.6–10.2)
Chloride: 97 mmol/L — ABNORMAL LOW (ref 98–110)
Creat: 0.71 mg/dL (ref 0.50–1.10)
GFR, Est African American: >89 mL/min (ref >=60)
GFR, Est Non African American: >89 mL/min (ref >=60)
Glucose, Bld: 87 mg/dL (ref 70–99)
Potassium: 3.8 mmol/L (ref 3.5–5.3)
Sodium: 134 mmol/L — ABNORMAL LOW (ref 135–146)
Total Bilirubin: 0.3 mg/dL (ref 0.2–1.2)
Total Protein: 7.6 g/dL (ref 6.1–8.1)

## 2015-12-25 LAB — HEMOGLOBIN A1C
Hgb A1c MFr Bld: 5.7 % — ABNORMAL HIGH (ref <5.7)
Mean Plasma Glucose: 117 mg/dL — ABNORMAL HIGH (ref <117)

## 2015-12-25 LAB — CBC WITH DIFFERENTIAL/PLATELET
Basophils Absolute: 0.1 10*3/uL (ref 0.0–0.1)
Basophils Relative: 1 % (ref 0–1)
Eosinophils Absolute: 0.3 10*3/uL (ref 0.0–0.7)
Eosinophils Relative: 6 % — ABNORMAL HIGH (ref 0–5)
HCT: 42.9 % (ref 36.0–46.0)
Hemoglobin: 14.9 g/dL (ref 12.0–15.0)
Lymphocytes Relative: 46 % (ref 12–46)
Lymphs Abs: 2.6 10*3/uL (ref 0.7–4.0)
MCH: 32.2 pg (ref 26.0–34.0)
MCHC: 34.7 g/dL (ref 30.0–36.0)
MCV: 92.7 fL (ref 78.0–100.0)
MPV: 8.9 fL (ref 8.6–12.4)
Monocytes Absolute: 0.4 10*3/uL (ref 0.1–1.0)
Monocytes Relative: 7 % (ref 3–12)
Neutro Abs: 2.2 10*3/uL (ref 1.7–7.7)
Neutrophils Relative %: 40 % — ABNORMAL LOW (ref 43–77)
Platelets: 284 10*3/uL (ref 150–400)
RBC: 4.63 MIL/uL (ref 3.87–5.11)
RDW: 14.2 % (ref 11.5–15.5)
WBC: 5.6 10*3/uL (ref 4.0–10.5)

## 2015-12-25 LAB — TSH: TSH: 0.25 mIU/L — ABNORMAL LOW

## 2015-12-27 ENCOUNTER — Encounter: Payer: Self-pay | Admitting: Family Medicine

## 2016-01-06 ENCOUNTER — Telehealth: Payer: Self-pay | Admitting: Family Medicine

## 2016-01-06 NOTE — Telephone Encounter (Signed)
[facility]  PATIENT IS CALLING REGARDING HER THYROID LABS, AND ALSO WOULD LIKE TO GET A MED CALLED IN FOR STILL HAVING HEADACHES, SHE SAID HE HAD MENTIONED A MED FOR SINUS HEADACHES    [phone]

## 2016-01-08 NOTE — Telephone Encounter (Signed)
Tried to call but number has been disconnected

## 2016-01-09 MED ORDER — METHIMAZOLE 5 MG PO TABS: 5.0000 mg | ORAL_TABLET | Freq: Every day | ORAL | Status: DC

## 2016-01-09 MED ORDER — AMOXICILLIN-POT CLAVULANATE 875-125 MG PO TABS: 1.0000 | ORAL_TABLET | Freq: Two times a day (BID) | ORAL | Status: DC

## 2016-01-09 NOTE — Telephone Encounter (Signed)
augmentin 875 bid for 10 days 

## 2016-01-09 NOTE — Telephone Encounter (Signed)
Patient returned your call her number is [phone]

## 2016-01-09 NOTE — Telephone Encounter (Signed)
Spoke to pt and she states that she would like something for her HA that it got better but now she has been sick all week and her sinuses are killing her. Per your last note you were going to call in Augmentin - ok to do!

## 2016-01-09 NOTE — Telephone Encounter (Signed)
Med called to pharm and pt aware 

## 2016-02-13 DIAGNOSIS — J329 Chronic sinusitis, unspecified: Secondary | ICD-10-CM | POA: Diagnosis not present

## 2016-02-13 DIAGNOSIS — J309 Allergic rhinitis, unspecified: Secondary | ICD-10-CM | POA: Diagnosis not present

## 2016-03-16 ENCOUNTER — Other Ambulatory Visit: Payer: Self-pay | Admitting: Family Medicine

## 2016-03-17 ENCOUNTER — Other Ambulatory Visit: Payer: Self-pay | Admitting: Family Medicine

## 2016-04-04 DIAGNOSIS — J0100 Acute maxillary sinusitis, unspecified: Secondary | ICD-10-CM | POA: Diagnosis not present

## 2016-04-08 DIAGNOSIS — Z6827 Body mass index (BMI) 27.0-27.9, adult: Secondary | ICD-10-CM | POA: Diagnosis not present

## 2016-04-08 DIAGNOSIS — Z01419 Encounter for gynecological examination (general) (routine) without abnormal findings: Secondary | ICD-10-CM | POA: Diagnosis not present

## 2016-06-04 ENCOUNTER — Encounter: Payer: Self-pay | Admitting: Family Medicine

## 2016-06-04 ENCOUNTER — Ambulatory Visit (INDEPENDENT_AMBULATORY_CARE_PROVIDER_SITE_OTHER): Payer: BLUE CROSS/BLUE SHIELD | Admitting: Family Medicine

## 2016-06-04 VITALS — BP 108/74 | HR 78 | Temp 98.4°F | Resp 18 | Ht 64.0 in | Wt 161.0 lb

## 2016-06-04 DIAGNOSIS — H9201 Otalgia, right ear: Secondary | ICD-10-CM

## 2016-06-04 DIAGNOSIS — E0590 Thyrotoxicosis, unspecified without thyrotoxic crisis or storm: Secondary | ICD-10-CM | POA: Diagnosis not present

## 2016-06-04 LAB — TSH: TSH: 0.69 mIU/L

## 2016-06-04 MED ORDER — DICLOFENAC SODIUM 75 MG PO TBEC: 75.0000 mg | DELAYED_RELEASE_TABLET | Freq: Two times a day (BID) | ORAL | 0 refills | Status: DC

## 2016-06-04 NOTE — Progress Notes (Signed)
Subjective:    Patient ID: Alexandra Randall, female    DOB: December 13, 1965, 50 y.o.   MRN: [id number]  HPI9/2014 Patient is a 50 year old [place] American female who is here today for complete physical exam. She continues to smoke and is not ready to quit. She has a remote history of Graves' disease. She is on no medication she is overdue to recheck a TSH. She also has a history of hyperlipidemia. She has been off Lipitor for over 7 months and is overdue to recheck a fasting lipid panel. She declined her flu shot today. She is overdue for a tetanus vaccine and does request that today. Her mammogram is due later this year and she normally schedules that.  Her blood pressure is elevated today. It was also elevated recently in urgent care where she was treated for sinus infection.  She does not have a previous history of hypertension.  At that time, my plan was: 1. Routine general medical examination at a health care facility Patient declines a flu shot. She was given a tetanus shot today. She will schedule her mammogram. Pap smear sent today and I will await results. Check a CBC fasting lipid panel TSH and CMP. Her blood pressure is elevated today. I asked her to check it everyday at home for the next week and membrane in values in one week. If her blood pressures greater than 140/90 he'll recommend medication. Also recommended low sodium diet. Also recommended smoking cessation. Patient's breast exam is normal - PAP, Thin Prep w/HPV rflx HPV Type 16/18 - COMPLETE METABOLIC PANEL WITH GFR - CBC with Differential - Lipid panel - TSH  2. Need for prophylactic vaccination and inoculation against unspecified single disease - Tdap vaccine greater than or equal to 7yo IM  12/24/15 Patient has not been seen since 06/2013.  Over the last 6 days she reports a constant pressure-like pain behind both eyes beneath the nasal bridge and in her cheeks. It is a constant pressure-like pain. She denies any fevers or chills  or rhinorrhea. She denies any cough or sore throat or flulike symptoms. She does report insomnia as she has been having more trouble sleeping recently which may be contributing to a tension headache. She denies any photophobia or phonophobia. She went to the urgent care this weekend due to the headache and was given some Advil. At that time she states that her blood sugar was 199. This is a random blood sugar. Per the patient she has diabetes although I have no documentation of this. She also complains of generalized fatigue and weakness recently.  At that time, my plan was:  I believe the patient is having a tension headache or sinus infection. I will treat a tension headache with Valium 5 mg every 8 hours as needed for headache. Hopefully this will help with the insomnia as well. If the headache is not improving by Friday, I would recommend empiric treatment for sinus infection with Augmentin. Given her recent hyperglycemia, I will check basic lab work including a CMP, CBC, and hemoglobin A1c. I will also check a TSH. Depending upon the lab results, we may discuss medication versus a low carbohydrate diet.  06/04/16 Patient ultimately began methimazole 5 mg daily to manage her hyperthyroidism. She states she feels better since taking it. She's been on it now for almost 6 months. She is not as fatigued as she once was. She denies any palpitations or hair loss. However she does complain of severe pain in her right  ear for 1 week. On examination the right tympanic membrane is pearly gray. There is no middle ear effusion. There is no erythema or exudate in the right external auditory canal. There is no rhinorrhea or evidence of a sinus infection. There is no erythema or exudate in the posterior oropharynx. There is no lymphadenopathy in the neck exam is unremarkable Past Medical History:  Diagnosis Date  . Diabetes mellitus without complication ([place])   . Eczema   . GERD (gastroesophageal reflux disease)   .  Graves disease   . Heart murmur   . Hyperlipidemia    Current Outpatient Prescriptions on File Prior to Visit  Medication Sig Dispense Refill  . atorvastatin (LIPITOR) 40 MG tablet     . Blood Glucose Monitoring Suppl (ONE TOUCH ULTRA 2) w/Device KIT USE AS DIRECTED TO CHECK BLOOD SUGAR DAILY  0  . diazepam (VALIUM) 5 MG tablet Take 1 tablet (5 mg total) by mouth every 8 (eight) hours as needed (headache). 30 tablet 0  . lisinopril-hydrochlorothiazide (PRINZIDE,ZESTORETIC) 20-12.5 MG tablet Take 1 tablet by mouth daily.  1  . loratadine (CLARITIN) 10 MG tablet Take 10 mg by mouth daily.    . methimazole (TAPAZOLE) 5 MG tablet TAKE 1 TABLET EVERY DAY 30 tablet 1  . ONE TOUCH ULTRA TEST test strip USE AS DIRECTED TO CHECK BLOOD SUGAR DAILY  6   No current facility-administered medications on file prior to visit.    Allergies  Allergen Reactions  . Hydrocodone Other (See Comments)    Heart palpitations    Social History   Social History  . Marital status: Divorced    Spouse name: N/A  . Number of children: N/A  . Years of education: N/A   Occupational History  . Not on file.   Social History Main Topics  . Smoking status: Current Every Day Smoker    Packs/day: 0.50    Years: 20.00    Types: Cigarettes  . Smokeless tobacco: Never Used  . Alcohol use Yes     Comment: Occasion  . Drug use: No  . Sexual activity: Yes    Birth control/ protection: Condom   Other Topics Concern  . Not on file   Social History Narrative  . No narrative on file   Family History  Problem Relation Age of Onset  . Cancer Mother 71    throat  . Cancer Father 53    lung      Review of Systems  All other systems reviewed and are negative.      Objective:   Physical Exam  Constitutional: She is oriented to person, place, and time. She appears well-developed and well-nourished. No distress.  HENT:  Head: Normocephalic and atraumatic.  Right Ear: External ear normal.  Left Ear:  External ear normal.  Nose: Nose normal.  Mouth/Throat: Oropharynx is clear and moist. No oropharyngeal exudate.  Eyes: Conjunctivae and EOM are normal. Pupils are equal, round, and reactive to light. Right eye exhibits no discharge. Left eye exhibits no discharge. No scleral icterus.  Neck: Normal range of motion. Neck supple. No JVD present. No tracheal deviation present. No thyromegaly present.  Cardiovascular: Normal rate, regular rhythm and intact distal pulses.  Exam reveals no gallop and no friction rub.   Murmur heard. Pulmonary/Chest: Effort normal and breath sounds normal. No stridor. No respiratory distress. She has no wheezes. She has no rales. She exhibits no tenderness.  Abdominal: Soft. Bowel sounds are normal. She exhibits no distension and  no mass. There is no tenderness. There is no rebound and no guarding.  Genitourinary: Vagina normal and uterus normal. No vaginal discharge found.  Musculoskeletal: Normal range of motion. She exhibits no edema or tenderness.  Lymphadenopathy:    She has no cervical adenopathy.  Neurological: She is alert and oriented to person, place, and time. She has normal reflexes. No cranial nerve deficit. She exhibits normal muscle tone. Coordination normal.  Skin: Skin is warm. No rash noted. She is not diaphoretic. No erythema. No pallor.  Psychiatric: She has a normal mood and affect. Her behavior is normal. Judgment and thought content normal.  Vitals reviewed.        Assessment & Plan:   Otalgia of right ear - Plan: diclofenac (VOLTAREN) 75 MG EC tablet  Hyperthyroidism - Plan: TSH Check TSH. Titrate dose of methimazole to achieve a TSH within the therapeutic range. Physical exam today is completely normal and no pronation for her otalgia is seen. Begin diclofenac 75 mg by mouth twice a day for possible TMJ disorder. Recheck in one week or sooner if worse

## 2016-06-08 ENCOUNTER — Encounter: Payer: Self-pay | Admitting: Family Medicine

## 2016-06-08 ENCOUNTER — Ambulatory Visit (INDEPENDENT_AMBULATORY_CARE_PROVIDER_SITE_OTHER): Payer: BLUE CROSS/BLUE SHIELD | Admitting: Family Medicine

## 2016-06-08 VITALS — BP 138/78 | HR 68 | Temp 97.9°F | Resp 18 | Ht 64.0 in | Wt 161.0 lb

## 2016-06-08 DIAGNOSIS — R51 Headache: Secondary | ICD-10-CM | POA: Diagnosis not present

## 2016-06-08 DIAGNOSIS — G44209 Tension-type headache, unspecified, not intractable: Secondary | ICD-10-CM

## 2016-06-08 DIAGNOSIS — R519 Headache, unspecified: Secondary | ICD-10-CM

## 2016-06-08 MED ORDER — AMOXICILLIN-POT CLAVULANATE 875-125 MG PO TABS: 1.0000 | ORAL_TABLET | Freq: Two times a day (BID) | ORAL | 0 refills | Status: DC

## 2016-06-08 MED ORDER — DIAZEPAM 5 MG PO TABS: 5.0000 mg | ORAL_TABLET | Freq: Three times a day (TID) | ORAL | 0 refills | Status: DC | PRN

## 2016-06-08 NOTE — Progress Notes (Signed)
Subjective:    Patient ID: Alexandra Randall, female    DOB: 04/13/1966, 50 y.o.   MRN: [id number]  Medication Refill   06/2013 Patient is a 50 year old [place] American female who is here today for complete physical exam. She continues to smoke and is not ready to quit. She has a remote history of Graves' disease. She is on no medication she is overdue to recheck a TSH. She also has a history of hyperlipidemia. She has been off Lipitor for over 7 months and is overdue to recheck a fasting lipid panel. She declined her flu shot today. She is overdue for a tetanus vaccine and does request that today. Her mammogram is due later this year and she normally schedules that.  Her blood pressure is elevated today. It was also elevated recently in urgent care where she was treated for sinus infection.  She does not have a previous history of hypertension.  At that time, my plan was: 1. Routine general medical examination at a health care facility Patient declines a flu shot. She was given a tetanus shot today. She will schedule her mammogram. Pap smear sent today and I will await results. Check a CBC fasting lipid panel TSH and CMP. Her blood pressure is elevated today. I asked her to check it everyday at home for the next week and membrane in values in one week. If her blood pressures greater than 140/90 he'll recommend medication. Also recommended low sodium diet. Also recommended smoking cessation. Patient's breast exam is normal - PAP, Thin Prep w/HPV rflx HPV Type 16/18 - COMPLETE METABOLIC PANEL WITH GFR - CBC with Differential - Lipid panel - TSH  2. Need for prophylactic vaccination and inoculation against unspecified single disease - Tdap vaccine greater than or equal to 7yo IM  12/24/15 Patient has not been seen since 06/2013.  Over the last 6 days she reports a constant pressure-like pain behind both eyes beneath the nasal bridge and in her cheeks. It is a constant pressure-like pain. She denies any  fevers or chills or rhinorrhea. She denies any cough or sore throat or flulike symptoms. She does report insomnia as she has been having more trouble sleeping recently which may be contributing to a tension headache. She denies any photophobia or phonophobia. She went to the urgent care this weekend due to the headache and was given some Advil. At that time she states that her blood sugar was 199. This is a random blood sugar. Per the patient she has diabetes although I have no documentation of this. She also complains of generalized fatigue and weakness recently.  At that time, my plan was:  I believe the patient is having a tension headache or sinus infection. I will treat a tension headache with Valium 5 mg every 8 hours as needed for headache. Hopefully this will help with the insomnia as well. If the headache is not improving by Friday, I would recommend empiric treatment for sinus infection with Augmentin. Given her recent hyperglycemia, I will check basic lab work including a CMP, CBC, and hemoglobin A1c. I will also check a TSH. Depending upon the lab results, we may discuss medication versus a low carbohydrate diet.  06/04/16 Patient ultimately began methimazole 5 mg daily to manage her hyperthyroidism. She states she feels better since taking it. She's been on it now for almost 6 months. She is not as fatigued as she once was. She denies any palpitations or hair loss. However she does complain of severe  pain in her right ear for 1 week. On examination the right tympanic membrane is pearly gray. There is no middle ear effusion. There is no erythema or exudate in the right external auditory canal. There is no rhinorrhea or evidence of a sinus infection. There is no erythema or exudate in the posterior oropharynx. There is no lymphadenopathy in the neck exam is unremarkable.  AT that time, my plan was: Check TSH. Titrate dose of methimazole to achieve a TSH within the therapeutic range. Physical exam  today is completely normal and no pronation for her otalgia is seen. Begin diclofenac 75 mg by mouth twice a day for possible TMJ disorder. Recheck in one week or sooner if worse  06/08/16 Patient states the pain is worsening on the right side of her face. The pain is still located around the ear. However she also complains of pain onto her for head and into her right sinus. She complains of congestion behind her right eye. She also complains of pain in tension pain over the right side of her head above her ear. She is under more stress recently as her son is joining the [employer]. Diclofenac has not been beneficial. She denies any hearing loss. She denies any tinnitus. She denies any vision changes. She denies any memory changes or personality changes. Past Medical History:  Diagnosis Date  . Diabetes mellitus without complication ([employer])   . Eczema   . GERD (gastroesophageal reflux disease)   . Graves disease   . Heart murmur   . Hyperlipidemia    Current Outpatient Prescriptions on File Prior to Visit  Medication Sig Dispense Refill  . atorvastatin (LIPITOR) 40 MG tablet     . Biotin 1000 MCG tablet Take 1,000 mcg by mouth 3 (three) times daily.    . Blood Glucose Monitoring Suppl (ONE TOUCH ULTRA 2) w/Device KIT USE AS DIRECTED TO CHECK BLOOD SUGAR DAILY  0  . Cholecalciferol (VITAMIN D) 2000 units CAPS Take by mouth.    . diclofenac (VOLTAREN) 75 MG EC tablet Take 1 tablet (75 mg total) by mouth 2 (two) times daily. 30 tablet 0  . lisinopril-hydrochlorothiazide (PRINZIDE,ZESTORETIC) 20-12.5 MG tablet Take 1 tablet by mouth daily.  1  . loratadine (CLARITIN) 10 MG tablet Take 10 mg by mouth daily.    . methimazole (TAPAZOLE) 5 MG tablet TAKE 1 TABLET EVERY DAY 30 tablet 1  . montelukast (SINGULAIR) 10 MG tablet Take 10 mg by mouth daily.     . ONE TOUCH ULTRA TEST test strip USE AS DIRECTED TO CHECK BLOOD SUGAR DAILY  6   No current facility-administered medications on file prior to visit.     Allergies  Allergen Reactions  . Hydrocodone Other (See Comments)    Heart palpitations    Social History   Social History  . Marital status: Divorced    Spouse name: N/A  . Number of children: N/A  . Years of education: N/A   Occupational History  . Not on file.   Social History Main Topics  . Smoking status: Current Every Day Smoker    Packs/day: 0.50    Years: 20.00    Types: Cigarettes  . Smokeless tobacco: Never Used  . Alcohol use Yes     Comment: Occasion  . Drug use: No  . Sexual activity: Yes    Birth control/ protection: Condom   Other Topics Concern  . Not on file   Social History Narrative  . No narrative on file  Family History  Problem Relation Age of Onset  . Cancer Mother 71    throat  . Cancer Father 67    lung      Review of Systems  All other systems reviewed and are negative.      Objective:   Physical Exam  Constitutional: She is oriented to person, place, and time. She appears well-developed and well-nourished. No distress.  HENT:  Head: Normocephalic and atraumatic.  Right Ear: External ear normal.  Left Ear: External ear normal.  Nose: Nose normal.  Mouth/Throat: Oropharynx is clear and moist. No oropharyngeal exudate.  Eyes: Conjunctivae and EOM are normal. Pupils are equal, round, and reactive to light. Right eye exhibits no discharge. Left eye exhibits no discharge. No scleral icterus.  Neck: Normal range of motion. Neck supple. No JVD present. No tracheal deviation present. No thyromegaly present.  Cardiovascular: Normal rate, regular rhythm and intact distal pulses.  Exam reveals no gallop and no friction rub.   Murmur heard. Pulmonary/Chest: Effort normal and breath sounds normal. No stridor. No respiratory distress. She has no wheezes. She has no rales. She exhibits no tenderness.  Abdominal: Soft. Bowel sounds are normal. She exhibits no distension and no mass. There is no tenderness. There is no rebound and no  guarding.  Genitourinary: Vagina normal and uterus normal. No vaginal discharge found.  Musculoskeletal: Normal range of motion. She exhibits no edema or tenderness.  Lymphadenopathy:    She has no cervical adenopathy.  Neurological: She is alert and oriented to person, place, and time. She has normal reflexes. No cranial nerve deficit. She exhibits normal muscle tone. Coordination normal.  Skin: Skin is warm. No rash noted. She is not diaphoretic. No erythema. No pallor.  Psychiatric: She has a normal mood and affect. Her behavior is normal. Judgment and thought content normal.  Vitals reviewed.        Assessment & Plan:   Facial pain - Plan: amoxicillin-clavulanate (AUGMENTIN) 875-125 MG tablet, diazepam (VALIUM) 5 MG tablet  Tension headache - Plan: diazepam (VALIUM) 5 MG tablet Her exam is unremarkable. I still feel we are dealing with TMJ. I recommended Valium 5 mg every 8 hours as needed for tension headache. Continue diclofenac 75 mg by mouth twice a day. Patient believes she may be developing a sinus infection. We will start Augmentin 875 mg by mouth twice a day for 10 days. Recheck in one week if no better or sooner if worse

## 2016-08-13 DIAGNOSIS — J019 Acute sinusitis, unspecified: Secondary | ICD-10-CM | POA: Diagnosis not present

## 2016-08-13 DIAGNOSIS — R05 Cough: Secondary | ICD-10-CM | POA: Diagnosis not present

## 2016-08-13 DIAGNOSIS — J309 Allergic rhinitis, unspecified: Secondary | ICD-10-CM | POA: Insufficient documentation

## 2016-08-27 DIAGNOSIS — K5904 Chronic idiopathic constipation: Secondary | ICD-10-CM | POA: Diagnosis not present

## 2016-08-27 DIAGNOSIS — Z1211 Encounter for screening for malignant neoplasm of colon: Secondary | ICD-10-CM | POA: Diagnosis not present

## 2016-09-25 DIAGNOSIS — M545 Low back pain: Secondary | ICD-10-CM | POA: Diagnosis not present

## 2016-09-25 DIAGNOSIS — R300 Dysuria: Secondary | ICD-10-CM | POA: Diagnosis not present

## 2016-11-09 ENCOUNTER — Other Ambulatory Visit: Payer: Self-pay | Admitting: Family Medicine

## 2016-11-10 NOTE — Telephone Encounter (Signed)
RX filled per protocol 

## 2016-12-17 ENCOUNTER — Encounter: Payer: Self-pay | Admitting: Physician Assistant

## 2016-12-17 ENCOUNTER — Ambulatory Visit (INDEPENDENT_AMBULATORY_CARE_PROVIDER_SITE_OTHER): Payer: BLUE CROSS/BLUE SHIELD | Admitting: Physician Assistant

## 2016-12-17 VITALS — BP 104/72 | HR 71 | Temp 98.1°F | Resp 18 | Wt 163.4 lb

## 2016-12-17 DIAGNOSIS — M5431 Sciatica, right side: Secondary | ICD-10-CM

## 2016-12-17 MED ORDER — TRAMADOL HCL 50 MG PO TABS: ORAL_TABLET | ORAL | 0 refills | Status: DC

## 2016-12-17 MED ORDER — METHYLPREDNISOLONE ACETATE 80 MG/ML IJ SUSP
80.0000 mg | Freq: Once | INTRAMUSCULAR | Status: AC
  Administered 2016-12-17: 80 mg via INTRAMUSCULAR

## 2016-12-17 NOTE — Addendum Note (Signed)
Addended by: Phineas SemenJOHNSON, TIFFANY A on: 12/17/2016 09:12 AM   Modules accepted: Orders

## 2016-12-17 NOTE — Progress Notes (Signed)
Patient ID: Alexandra Randall MRN: [id number], DOB: 06/17/1966, 51 y.o. Date of Encounter: 12/17/2016, 8:27 AM    Chief Complaint:  Chief Complaint  Patient presents with  . right leg pain    x1 wk     HPI: 51 y.o. year old female presents with above.    She states that she has ben having cramping in he right calf that goes up into her right thigh  " now in my back"  Has just been going on over the past one week. Has not had problems with this kind of symptoms prior to this past week. Says now she has been having discomfort in her low back as well.  Says that it started when she was at work and picked up something and then twisted to the side. Says that this has been happening since then. 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 She is scheduled to work Thursday through Sunday 7P to 7 a and does not have a day off scheduled until Monday. Says that she can take off tonight and does not need any note to turn in.     Home Meds:   Outpatient Medications Prior to Visit  Medication Sig Dispense Refill  . atorvastatin (LIPITOR) 40 MG tablet     . Biotin 1000 MCG tablet Take 1,000 mcg by mouth 3 (three) times daily.    . Blood Glucose Monitoring Suppl (ONE TOUCH ULTRA 2) w/Device KIT USE AS DIRECTED TO CHECK BLOOD SUGAR DAILY  0  . Cholecalciferol (VITAMIN D) 2000 units CAPS Take by mouth.    Marland Kitchen lisinopril-hydrochlorothiazide (PRINZIDE,ZESTORETIC) 20-12.5 MG tablet Take 1 tablet by mouth daily.  1  . loratadine (CLARITIN) 10 MG tablet Take 10 mg by mouth daily.    . methimazole (TAPAZOLE) 5 MG tablet TAKE 1 TABLET BY MOUTH DAILY 30 tablet 1  . montelukast (SINGULAIR) 10 MG tablet Take 10 mg by mouth daily.     . ONE TOUCH ULTRA TEST test strip USE AS DIRECTED TO CHECK BLOOD SUGAR DAILY  6  . amoxicillin-clavulanate (AUGMENTIN) 875-125 MG tablet Take 1 tablet by mouth 2 (two) times daily. (Patient not taking: Reported on 12/17/2016) 20 tablet 0  . diazepam (VALIUM) 5 MG  tablet Take 1 tablet (5 mg total) by mouth every 8 (eight) hours as needed (headache). (Patient not taking: Reported on 12/17/2016) 30 tablet 0  . diclofenac (VOLTAREN) 75 MG EC tablet Take 1 tablet (75 mg total) by mouth 2 (two) times daily. (Patient not taking: Reported on 12/17/2016) 30 tablet 0   No facility-administered medications prior to visit.     Allergies:  Allergies  Allergen Reactions  . Hydrocodone Other (See Comments)    Heart palpitations       Review of Systems: See HPI for pertinent ROS. All other ROS negative.    Physical Exam: Blood pressure 104/72, pulse 71, temperature 98.1 F (36.7 C), resp. rate 18, weight 163 lb 6.4 oz (74.1 kg), last menstrual period 10/16/2016, SpO2 99 %., Body mass index is 28.05 kg/m. General:  WNWD AAF. Appears in no acute distress. Neck: Supple. No thyromegaly. No lymphadenopathy. Lungs: Clear bilaterally to auscultation without wheezes, rales, or rhonchi. Breathing is unlabored. Heart: Regular rhythm. No murmurs, rubs, or gallops. Msk:  Strength and tone normal for age.  Left straight leg raise when she raises to 45 she says hat she is feeling discomfort acrss her low back. Can do hip abduction but is also  discomfort across her low back.  The right she can do straight leg raise about 15 degrees but then complins of pain in the back of hr right thigh as well as in her low back and her buttock region. Hip abduction is also limited and complains of discomfort in the posterior right thigh buttock and right low back. Extremities/Skin: Warm and dry.  Neuro: Alert and oriented X 3. Moves all extremities spontaneously. Gait is normal. CNII-XII grossly in tact. Psych:  Responds to questions appropriately with a normal affect.     ASSESSMENT AND PLAN:  51 y.o. year old female with  1. Right sided sciatica  Depo-Medrol 51 m IMnow.  She is rquesting soethig orpain to use as well but cannotuse hydrocodone s willgive tramadl.  She states that  she will take heroff wok tonight but does not need a note. During that time she is to rest hrback. She is to follow-up if symtms do notreoe over theupcoming week or if they recur - traMADol (ULTRAM) 50 MG tablet; Take 1 -2 every 8 hours as needed for pain.  Dispense: 60 tablet; Refill: 0   Signed, [address] [place], [place], [facility] 12/17/2016 8:27 AM

## 2016-12-18 ENCOUNTER — Encounter: Payer: Self-pay | Admitting: Physician Assistant

## 2016-12-21 ENCOUNTER — Encounter: Payer: Self-pay | Admitting: Physician Assistant

## 2016-12-21 ENCOUNTER — Ambulatory Visit
Admission: RE | Admit: 2016-12-21 | Discharge: 2016-12-21 | Disposition: A | Discharge status: Home / Self Care | Payer: BLUE CROSS/BLUE SHIELD | Source: Ambulatory Visit | Attending: Physician Assistant | Admitting: Physician Assistant

## 2016-12-21 ENCOUNTER — Ambulatory Visit (INDEPENDENT_AMBULATORY_CARE_PROVIDER_SITE_OTHER): Payer: BLUE CROSS/BLUE SHIELD | Admitting: Physician Assistant

## 2016-12-21 VITALS — BP 124/82 | HR 65 | Temp 98.3°F | Resp 16 | Wt 160.2 lb

## 2016-12-21 DIAGNOSIS — M5440 Lumbago with sciatica, unspecified side: Secondary | ICD-10-CM | POA: Diagnosis not present

## 2016-12-21 DIAGNOSIS — M5136 Other intervertebral disc degeneration, lumbar region: Secondary | ICD-10-CM | POA: Diagnosis not present

## 2016-12-21 MED ORDER — CYCLOBENZAPRINE HCL 10 MG PO TABS: 10.0000 mg | ORAL_TABLET | Freq: Three times a day (TID) | ORAL | 1 refills | Status: DC | PRN

## 2016-12-21 NOTE — Progress Notes (Signed)
Patient ID: Alexandra Randall MRN: [id number], DOB: 17-Jan-1966, 51 y.o. Date of Encounter: 12/21/2016, 11:41 AM    Chief Complaint:  Chief Complaint  Patient presents with  . Back Pain  . leg cramps     HPI: 51 y.o. year old female presents with above.    12/17/2016:  She states that she has ben having cramping in he right calf that goes up into her right thigh  " now in my back"  Has just been going on over the past one week. Has not had problems with this kind of symptoms prior to this past week. Says now she has been having discomfort in her low back as well.  Says that it started when she was at work and picked up something and then twisted to the side. Says that this has been happening since then. 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 She is scheduled to work Thursday through Sunday 7P to 7 a and does not have a day off scheduled until Monday. Says that she can take off tonight and does not need any note to turn in. 1. Right sided sciatica  Depo-Medrol 80 m IMnow.  She is rquesting soethig orpain to use as well but cannotuse hydrocodone s willgive tramadl.  She states that she will take heroff wok tonight but does not need a note. During that time she is to rest hrback. She is to follow-up if symtms do notreoe over theupcoming week or if they recur - traMADol (ULTRAM) 50 MG tablet; Take 1 -2 every 8 hours as needed for pain.  Dispense: 60 tablet; Refill: 0    12/21/2016: Today she reports that she got very minimal relief after the Depo-Medrol injection. Says that on Saturday she got ready to go to work and developed cramps in her legs again. Did not go to work "because she will did not want to fall on that concrete floor". Says that the cramping is occurring in her legs again and she is feeling pain in her buttocks and low back bilaterally. Says that she has been using 1 tramadol at the time and is feeling no relief with that. Says that she has been resting  her back like I had told her and has been applying heat and sitting in baths.  Says that she has had some problem with her back before but it has never affected her legs in the past. One time she had to be out of work "because her back was out" but says that was years ago. Reviewed her chart and see no lumbar spine x-ray in Epic at all.  No saddle anesthesia. No incontinence of bowel or bladder.No foot drop.    Home Meds:   Outpatient Medications Prior to Visit  Medication Sig Dispense Refill  . atorvastatin (LIPITOR) 40 MG tablet     . Biotin 1000 MCG tablet Take 1,000 mcg by mouth 3 (three) times daily.    . Blood Glucose Monitoring Suppl (ONE TOUCH ULTRA 2) w/Device KIT USE AS DIRECTED TO CHECK BLOOD SUGAR DAILY  0  . Cholecalciferol (VITAMIN D) 2000 units CAPS Take by mouth.    . cyclobenzaprine (FLEXERIL) 5 MG tablet Take 5 mg by mouth 3 (three) times daily as needed.  0  . lisinopril-hydrochlorothiazide (PRINZIDE,ZESTORETIC) 20-12.5 MG tablet Take 1 tablet by mouth daily.  1  . loratadine (CLARITIN) 10 MG tablet Take 10 mg by mouth daily.    . methimazole (TAPAZOLE) 5 MG  tablet TAKE 1 TABLET BY MOUTH DAILY 30 tablet 1  . montelukast (SINGULAIR) 10 MG tablet Take 10 mg by mouth daily.     . naproxen (NAPROSYN) 500 MG tablet Take 500 mg by mouth daily.   0  . ONE TOUCH ULTRA TEST test strip USE AS DIRECTED TO CHECK BLOOD SUGAR DAILY  6  . traMADol (ULTRAM) 50 MG tablet Take 1 -2 every 8 hours as needed for pain. 60 tablet 0  . amoxicillin-clavulanate (AUGMENTIN) 875-125 MG tablet Take 1 tablet by mouth 2 (two) times daily. (Patient not taking: Reported on 12/17/2016) 20 tablet 0  . diazepam (VALIUM) 5 MG tablet Take 1 tablet (5 mg total) by mouth every 8 (eight) hours as needed (headache). (Patient not taking: Reported on 12/17/2016) 30 tablet 0  . diclofenac (VOLTAREN) 75 MG EC tablet Take 1 tablet (75 mg total) by mouth 2 (two) times daily. (Patient not taking: Reported on 12/17/2016)  30 tablet 0   No facility-administered medications prior to visit.     Allergies:  Allergies  Allergen Reactions  . Hydrocodone Other (See Comments)    Heart palpitations       Review of Systems: See HPI for pertinent ROS. All other ROS negative.    Physical Exam: Blood pressure 124/82, pulse 65, temperature 98.3 F (36.8 C), temperature source Oral, resp. rate 16, weight 160 lb 3.2 oz (72.7 kg), last menstrual period 10/12/2016, SpO2 99 %., Body mass index is 27.5 kg/m. General:  WNWD AAF. Appears in no acute distress. Neck: Supple. No thyromegaly. No lymphadenopathy. Lungs: Clear bilaterally to auscultation without wheezes, rales, or rhonchi. Breathing is unlabored. Heart: Regular rhythm. No murmurs, rubs, or gallops. Msk:  Strength and tone normal for age.  Left straight leg raise--She can not raises quite to 45 --- she says that she is feeling discomfort across her low back, buttocks. Also has limited hip abduction---causes  discomfort across her low back, buttocks. On the right she can do straight leg raise about 15 degrees but then complains of pain in the back of her right thigh as well as in her low back and her buttock region. Hip abduction is also limited and complains of discomfort in the posterior right thigh buttock and right low back. Extremities/Skin: Warm and dry.  Neuro: Alert and oriented X 3. Moves all extremities spontaneously. Gait is normal. CNII-XII grossly in tact. Psych:  Responds to questions appropriately with a normal affect.     ASSESSMENT AND PLAN:  51 y.o. year old female with    1. Acute bilateral low back pain with sciatica, sciatica laterality unspecified Obtain x-ray lumbar spine. Will follow-up with her when I get that result. She has used Flexeril in the past so will use this as muscle relaxer. Cautioned not to take prior to driving and cautioned may cause drowsiness. Intolerant to hydrocodone. This is on her allergy list. She is to take  2 of the tramadol at a time and can repeat this every 8 hours. Right her to be out of work through the upcoming Monday which is he/5/18. Will schedule follow-up visit with me on Mondays 12/28/16. She is to contact her work regarding FMLA and I will complete this. - DG Lumbar Spine Complete; Future - cyclobenzaprine (FLEXERIL) 10 MG tablet; Take 1 tablet (10 mg total) by mouth 3 (three) times daily as needed for muscle spasms.  Dispense: 60 tablet; Refill: 1    Signed, [address] [place], [place], [facility] 12/21/2016 11:41 AM

## 2016-12-25 ENCOUNTER — Telehealth: Payer: Self-pay | Admitting: Family Medicine

## 2016-12-25 NOTE — Telephone Encounter (Signed)
[facility] Tanya Nones[facility]ard has not seen pt since 05/2016

## 2016-12-25 NOTE — Telephone Encounter (Signed)
Received fmla papers and routed to sandy on 12/25/2016

## 2016-12-28 ENCOUNTER — Telehealth: Payer: Self-pay

## 2016-12-28 ENCOUNTER — Encounter: Payer: Self-pay | Admitting: Physician Assistant

## 2016-12-28 ENCOUNTER — Ambulatory Visit (INDEPENDENT_AMBULATORY_CARE_PROVIDER_SITE_OTHER): Payer: BLUE CROSS/BLUE SHIELD | Admitting: Physician Assistant

## 2016-12-28 VITALS — BP 138/78 | HR 79 | Temp 98.1°F | Resp 14 | Wt 164.4 lb

## 2016-12-28 DIAGNOSIS — M5441 Lumbago with sciatica, right side: Secondary | ICD-10-CM

## 2016-12-28 DIAGNOSIS — M5442 Lumbago with sciatica, left side: Secondary | ICD-10-CM

## 2016-12-28 NOTE — Telephone Encounter (Signed)
[facility] called about short term disability paperwork for Alexandra Randall,  Received paperwork today placing in Alexandra Randall box to be completed

## 2016-12-28 NOTE — Progress Notes (Signed)
Patient ID: Alexandra Randall MRN: [id number], DOB: [birth year], 51 y.o. Date of Encounter: 12/28/2016, 11:43 AM    Chief Complaint:  Chief Complaint  Patient presents with  . Back Pain    1 wk f/u     HPI: 51 y.o. year old female presents with above.    12/17/2016:  She states that she has ben having cramping in he right calf that goes up into her right thigh  " now in my back"  Has just been going on over the past one week. Has not had problems with this kind of symptoms prior to this past week. Says now she has been having discomfort in her low back as well.  Says that it started when she was at work and picked up something and then twisted to the side. Says that this has been happening since then. 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 She is scheduled to work Thursday through Sunday 7P to 7 a and does not have a day off scheduled until Monday. Says that she can take off tonight and does not need any note to turn in. 1. Right sided sciatica  Depo-Medrol 80 m IMnow.  She is rquesting soethig orpain to use as well but cannotuse hydrocodone s willgive tramadl.  She states that she will take heroff wok tonight but does not need a note. During that time she is to rest hrback. She is to follow-up if symtms do notreoe over theupcoming week or if they recur - traMADol (ULTRAM) 50 MG tablet; Take 1 -2 every 8 hours as needed for pain.  Dispense: 60 tablet; Refill: 0    12/21/2016: Today she reports that she got very minimal relief after the Depo-Medrol injection. Says that on Saturday she got ready to go to work and developed cramps in her legs again. Did not go to work "because she will did not want to fall on that concrete floor". Says that the cramping is occurring in her legs again and she is feeling pain in her buttocks and low back bilaterally. Says that she has been using 1 tramadol at the time and is feeling no relief with that. Says that she has been resting her  back like I had told her and has been applying heat and sitting in baths.  Says that she has had some problem with her back before but it has never affected her legs in the past. One time she had to be out of work "because her back was out" but says that was years ago. Reviewed her chart and see no lumbar spine x-ray in Epic at all.  No saddle anesthesia. No incontinence of bowel or bladder.No foot drop.  1. Acute bilateral low back pain with sciatica, sciatica laterality unspecified Obtain x-ray lumbar spine. Will follow-up with her when I get that result. She has used Flexeril in the past so will use this as muscle relaxer. Cautioned not to take prior to driving and cautioned may cause drowsiness. Intolerant to hydrocodone. This is on her allergy list. She is to take 2 of the tramadol at a time and can repeat this every 8 hours. Right her to be out of work through the upcoming Monday which is he/5/18. Will schedule follow-up visit with me on Mondays 12/28/16. She is to contact her work regarding FMLA and I will complete this. - DG Lumbar Spine Complete; Future - cyclobenzaprine (FLEXERIL) 10 MG tablet; Take 1 tablet (10 mg total) by  mouth 3 (three) times daily as needed for muscle spasms.  Dispense: 60 tablet; Refill: 1    12/28/2016: She states that her pain is some better but still having significant symptoms. Every 8 hours she takes 2 tramadol and 1 Flexeril. Says that she is not getting good sleep because of her legs hurting. Cannot get into a comfortable position to get good sleep. Lays on her side and puts a pillow between her knees but still not comfortable. She is walking some better but still having discomfort in her calf. Says it seems to alternate from the right leg to the left leg but overall the right leg has been worse. Does feel like the muscle spasms have stopped. Says that the back of her calf stays sore all the time.    Home Meds:   Outpatient Medications Prior to Visit    Medication Sig Dispense Refill  . atorvastatin (LIPITOR) 40 MG tablet     . Biotin 1000 MCG tablet Take 1,000 mcg by mouth 3 (three) times daily.    . Blood Glucose Monitoring Suppl (ONE TOUCH ULTRA 2) w/Device KIT USE AS DIRECTED TO CHECK BLOOD SUGAR DAILY  0  . Cholecalciferol (VITAMIN D) 2000 units CAPS Take by mouth.    . cyclobenzaprine (FLEXERIL) 10 MG tablet Take 1 tablet (10 mg total) by mouth 3 (three) times daily as needed for muscle spasms. 60 tablet 1  . lisinopril-hydrochlorothiazide (PRINZIDE,ZESTORETIC) 20-12.5 MG tablet Take 1 tablet by mouth daily.  1  . loratadine (CLARITIN) 10 MG tablet Take 10 mg by mouth daily.    . methimazole (TAPAZOLE) 5 MG tablet TAKE 1 TABLET BY MOUTH DAILY 30 tablet 1  . montelukast (SINGULAIR) 10 MG tablet Take 10 mg by mouth daily.     . naproxen (NAPROSYN) 500 MG tablet Take 500 mg by mouth daily.   0  . ONE TOUCH ULTRA TEST test strip USE AS DIRECTED TO CHECK BLOOD SUGAR DAILY  6  . traMADol (ULTRAM) 50 MG tablet Take 1 -2 every 8 hours as needed for pain. 60 tablet 0  . amoxicillin-clavulanate (AUGMENTIN) 875-125 MG tablet Take 1 tablet by mouth 2 (two) times daily. (Patient not taking: Reported on 12/17/2016) 20 tablet 0  . diazepam (VALIUM) 5 MG tablet Take 1 tablet (5 mg total) by mouth every 8 (eight) hours as needed (headache). (Patient not taking: Reported on 12/17/2016) 30 tablet 0  . diclofenac (VOLTAREN) 75 MG EC tablet Take 1 tablet (75 mg total) by mouth 2 (two) times daily. (Patient not taking: Reported on 12/17/2016) 30 tablet 0   No facility-administered medications prior to visit.     Allergies:  Allergies  Allergen Reactions  . Hydrocodone Other (See Comments)    Heart palpitations       Review of Systems: See HPI for pertinent ROS. All other ROS negative.    Physical Exam: Blood pressure 138/78, pulse 79, temperature 98.1 F (36.7 C), temperature source Oral, resp. rate 14, weight 164 lb 6.4 oz (74.6 kg), last menstrual  period 09/29/2016, SpO2 98 %., Body mass index is 28.22 kg/m. General:  WNWD AAF. Appears in no acute distress. Neck: Supple. No thyromegaly. No lymphadenopathy. Lungs: Clear bilaterally to auscultation without wheezes, rales, or rhonchi. Breathing is unlabored. Heart: Regular rhythm. No murmurs, rubs, or gallops. Msk:  Strength and tone normal for age.  Left straight leg raise--raises to about 15 --- she says that she is feeling discomfort in her right leg when she does this.  Also has limited hip abduction---causes  discomfort across her low back, buttocks. On the right she can do straight leg raise < 15 degrees but then complains of pain in the back of her right thigh as well as in her low back and her buttock region. Hip abduction is also severely  limited and complains of discomfort in the posterior right thigh buttock and right low back. Extremities/Skin: Warm and dry.  Neuro: Alert and oriented X 3. Moves all extremities spontaneously. Gait is normal. CNII-XII grossly in tact. Psych:  Responds to questions appropriately with a normal affect.     ASSESSMENT AND PLAN:  51 y.o. year old female with   1. Acute bilateral low back pain with bilateral sciatica Reviewed report of  x-ray lumbar spine 12/21/16. Degenerative disc space narrowing at L4-L5.  Mild facet joint hypertrophy at L5-S1. Given her persistent symptoms, will proceed with MRI. Follow up with her once I get the MRI results. In the interim she will continue current treatment taking Flexeril and 2 tramadol every 8 hours. Will remain out of work. Will follow up with her once I get MRI report. - MR Lumbar Spine Wo Contrast; Future     Signed, [address] [place], [place], [facility] 12/28/2016 11:43 AM

## 2017-01-05 NOTE — Telephone Encounter (Signed)
Alexandra FailWendy called in regards to her MRI as well as her FMLA paperwork. Patient states she has been out of work for going on 3 weeks now and her only excuse is that she is under the doctor's care.  Patient states she need to have the form filled out with what is currently going on with her and possibly put on the paperwork that she is to have an MRI done otherwise she is in jeopardy of losing her job.   Patient states her job has given her a time frame to turn the forms in, However the patient did not tell me the time frame.

## 2017-01-05 NOTE — Telephone Encounter (Signed)
I know. I have been speaking to Selena BattenKim daily to see if MRI approved---b/c I was waiting to get MRI to be able to put date on FMLA form--I have everything else filled out/completed on the FMLA form.  Just yesterday--Kim said she had JUST gotten MRI approved and they should be calling pt to schedule.  Has pt heard from MRI facility ?

## 2017-01-06 NOTE — Telephone Encounter (Signed)
Will fax over forms today patient is aware

## 2017-01-06 NOTE — Telephone Encounter (Signed)
Her appointment is not until 3-16 @1pm 

## 2017-01-07 ENCOUNTER — Encounter: Payer: Self-pay | Admitting: Family Medicine

## 2017-01-08 ENCOUNTER — Ambulatory Visit
Admission: RE | Admit: 2017-01-08 | Discharge: 2017-01-08 | Disposition: A | Discharge status: Home / Self Care | Payer: BLUE CROSS/BLUE SHIELD | Source: Ambulatory Visit | Attending: Physician Assistant | Admitting: Physician Assistant

## 2017-01-08 DIAGNOSIS — M5441 Lumbago with sciatica, right side: Secondary | ICD-10-CM

## 2017-01-08 DIAGNOSIS — M5126 Other intervertebral disc displacement, lumbar region: Secondary | ICD-10-CM | POA: Diagnosis not present

## 2017-01-08 DIAGNOSIS — M5442 Lumbago with sciatica, left side: Principal | ICD-10-CM

## 2017-01-11 ENCOUNTER — Encounter: Payer: Self-pay | Admitting: Physician Assistant

## 2017-01-11 ENCOUNTER — Ambulatory Visit (INDEPENDENT_AMBULATORY_CARE_PROVIDER_SITE_OTHER): Payer: BLUE CROSS/BLUE SHIELD | Admitting: Physician Assistant

## 2017-01-11 DIAGNOSIS — M519 Unspecified thoracic, thoracolumbar and lumbosacral intervertebral disc disorder: Secondary | ICD-10-CM | POA: Diagnosis not present

## 2017-01-11 DIAGNOSIS — M5431 Sciatica, right side: Secondary | ICD-10-CM | POA: Diagnosis not present

## 2017-01-11 MED ORDER — TRAMADOL HCL 50 MG PO TABS: ORAL_TABLET | ORAL | 0 refills | Status: DC

## 2017-01-11 NOTE — Progress Notes (Signed)
Patient ID: Alexandra Randall MRN: [id number], DOB: July 20, 1966, 51 y.o. Date of Encounter: 01/11/2017, 11:47 AM    Chief Complaint:  Chief Complaint  Patient presents with  . Back Pain    f/u     HPI: 51 y.o. year old female presents with above.    12/17/2016:  She states that she has ben having cramping in he right calf that goes up into her right thigh  " now in my back"  Has just been going on over the past one week. Has not had problems with this kind of symptoms prior to this past week. Says now she has been having discomfort in her low back as well.  Says that it started when she was at work and picked up something and then twisted to the side. Says that this has been happening since then. 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 She is scheduled to work Thursday through Sunday 7P to 7 a and does not have a day off scheduled until Monday. Says that she can take off tonight and does not need any note to turn in. 1. Right sided sciatica  Depo-Medrol 80 m IMnow.  She is rquesting soethig orpain to use as well but cannotuse hydrocodone s willgive tramadl.  She states that she will take heroff wok tonight but does not need a note. During that time she is to rest hrback. She is to follow-up if symtms do notreoe over theupcoming week or if they recur - traMADol (ULTRAM) 50 MG tablet; Take 1 -2 every 8 hours as needed for pain.  Dispense: 60 tablet; Refill: 0    12/21/2016: Today she reports that she got very minimal relief after the Depo-Medrol injection. Says that on Saturday she got ready to go to work and developed cramps in her legs again. Did not go to work "because she will did not want to fall on that concrete floor". Says that the cramping is occurring in her legs again and she is feeling pain in her buttocks and low back bilaterally. Says that she has been using 1 tramadol at the time and is feeling no relief with that. Says that she has been resting her back  like I had told her and has been applying heat and sitting in baths.  Says that she has had some problem with her back before but it has never affected her legs in the past. One time she had to be out of work "because her back was out" but says that was years ago. Reviewed her chart and see no lumbar spine x-ray in Epic at all.  No saddle anesthesia. No incontinence of bowel or bladder.No foot drop.  1. Acute bilateral low back pain with sciatica, sciatica laterality unspecified Obtain x-ray lumbar spine. Will follow-up with her when I get that result. She has used Flexeril in the past so will use this as muscle relaxer. Cautioned not to take prior to driving and cautioned may cause drowsiness. Intolerant to hydrocodone. This is on her allergy list. She is to take 2 of the tramadol at a time and can repeat this every 8 hours. Right her to be out of work through the upcoming Monday which is 51/5/18. Will schedule follow-up visit with me on Mondays 12/28/16. She is to contact her work regarding FMLA and I will complete this. - DG Lumbar Spine Complete; Future - cyclobenzaprine (FLEXERIL) 10 MG tablet; Take 1 tablet (10 mg total) by mouth 3 (  three) times daily as needed for muscle spasms.  Dispense: 60 tablet; Refill: 1    12/28/2016: She states that her pain is some better but still having significant symptoms. Every 8 hours she takes 2 tramadol and 1 Flexeril. Says that she is not getting good sleep because of her legs hurting. Cannot get into a comfortable position to get good sleep. Lays on her side and puts a pillow between her knees but still not comfortable. She is walking some better but still having discomfort in her calf. Says it seems to alternate from the right leg to the left leg but overall the right leg has been worse. Does feel like the muscle spasms have stopped. Says that the back of her calf stays sore all the time.  1. Acute bilateral low back pain with bilateral  sciatica Reviewed report of  x-ray lumbar spine 12/21/16. Degenerative disc space narrowing at L4-L5.  Mild facet joint hypertrophy at L5-S1. Given her persistent symptoms, will proceed with MRI. Follow up with her once I get the MRI results. In the interim she will continue current treatment taking Flexeril and 2 tramadol every 8 hours. Will remain out of work. Will follow up with her once I get MRI report. - MR Lumbar Spine Wo Contrast; Future   01/11/2017: Today she reports that overall the pain has been better. Feels a little bit of soreness in the back of her calves and some soreness in her very low back--down towards her buttocks at about L5 level. says that she has only felt spasms one day last week she felt spasms 3 times in one day in her legs but has had no spasms since then. She thinks that she is feeling improved to be able to try to go back to work tomorrow. Is almost out of her tramadol and needs a new prescription on that. Still has enough Flexeril right now. At todays visit, discussed MRI report. Recommend f/u with Neurosurgeon--told her this does not mean she has to have surgery--just to get an expert's opinion on her treatment plan---she is agreeable and voices understanding.   Home Meds:   Outpatient Medications Prior to Visit  Medication Sig Dispense Refill  . atorvastatin (LIPITOR) 40 MG tablet     . Biotin 1000 MCG tablet Take 1,000 mcg by mouth 3 (three) times daily.    . Blood Glucose Monitoring Suppl (ONE TOUCH ULTRA 2) w/Device KIT USE AS DIRECTED TO CHECK BLOOD SUGAR DAILY  0  . Cholecalciferol (VITAMIN D) 2000 units CAPS Take by mouth.    . cyclobenzaprine (FLEXERIL) 10 MG tablet Take 1 tablet (10 mg total) by mouth 3 (three) times daily as needed for muscle spasms. 60 tablet 1  . diazepam (VALIUM) 5 MG tablet Take 1 tablet (5 mg total) by mouth every 8 (eight) hours as needed (headache). 30 tablet 0  . diclofenac (VOLTAREN) 75 MG EC tablet Take 1 tablet (75 mg total)  by mouth 2 (two) times daily. 30 tablet 0  . lisinopril-hydrochlorothiazide (PRINZIDE,ZESTORETIC) 20-12.5 MG tablet Take 1 tablet by mouth daily.  1  . loratadine (CLARITIN) 10 MG tablet Take 10 mg by mouth daily.    . methimazole (TAPAZOLE) 5 MG tablet TAKE 1 TABLET BY MOUTH DAILY 30 tablet 1  . montelukast (SINGULAIR) 10 MG tablet Take 10 mg by mouth daily.     . naproxen (NAPROSYN) 500 MG tablet Take 500 mg by mouth daily.   0  . ONE TOUCH ULTRA TEST test strip  USE AS DIRECTED TO CHECK BLOOD SUGAR DAILY  6  . traMADol (ULTRAM) 50 MG tablet Take 1 -2 every 8 hours as needed for pain. 60 tablet 0  . amoxicillin-clavulanate (AUGMENTIN) 875-125 MG tablet Take 1 tablet by mouth 2 (two) times daily. (Patient not taking: Reported on 12/17/2016) 20 tablet 0   No facility-administered medications prior to visit.     Allergies:  Allergies  Allergen Reactions  . Hydrocodone Other (See Comments)    Heart palpitations       Review of Systems: See HPI for pertinent ROS. All other ROS negative.    Physical Exam: Blood pressure 118/82, pulse 82, temperature 98.3 F (36.8 C), temperature source Oral, resp. rate 16, weight 163 lb 6.4 oz (74.1 kg), last menstrual period 10/12/2016, SpO2 99 %., Body mass index is 28.05 kg/m. General:  WNWD AAF. Appears in no acute distress. Neck: Supple. No thyromegaly. No lymphadenopathy. Lungs: Clear bilaterally to auscultation without wheezes, rales, or rhonchi. Breathing is unlabored. Heart: Regular rhythm. No murmurs, rubs, or gallops. Msk:  Strength and tone normal for age.  Left straight leg raise--raises to about 45 --- she says that she is feeling discomfort in her right leg when she does this. Also has limited hip abduction---causes  discomfort across her low back, buttocks. On the right she can do straight leg raise 45 degrees but then complains of pain in the back of her right thigh. Hip abduction is mildly limited but much improved compared to prior  visits. Extremities/Skin: Warm and dry.  Neuro: Alert and oriented X 3. Moves all extremities spontaneously. Gait is normal. CNII-XII grossly in tact. Psych:  Responds to questions appropriately with a normal affect.     ASSESSMENT AND PLAN:  51 y.o. year old female with   1. Acute bilateral low back pain with bilateral sciatica Reviewed MRI report, discussed with pt.  Recommend f/u with Neurosurgeon--told her this does not mean she has to have surgery--just to get an expert's opinion on her treatment plan---she is agreeable and voices understanding.  She is to continue the tramadol and the Flexeril. Plan for her to return to work tomorrow. I have already completed FMLA paperwork which kept her out of work through today and today we are giving her a note that she may return back to work tomorrow.  Disorder of intervertebral disc of lumbar spine - Ambulatory referral to Neurosurgery Today I reviewed that her last prior prescription for tramadol was on 12/17/16 for #60.  Today I printed another Rx for #60. - traMADol (ULTRAM) 50 MG tablet; Take 1 -2 every 8 hours as needed for pain.  Dispense: 60 tablet; Refill: 0    Signed, [id number] [place], [place], [facility] 01/11/2017 11:47 AM

## 2017-01-12 ENCOUNTER — Other Ambulatory Visit: Payer: Self-pay | Admitting: Family Medicine

## 2017-01-12 ENCOUNTER — Telehealth: Payer: Self-pay

## 2017-01-12 NOTE — Telephone Encounter (Signed)
Grover CanavanKrystal called and stated the provider signature was missing off of  the FMLA form and they need to have the form signed and faxed back  to them.  If you can sign it I will refax it.Thanks

## 2017-01-13 NOTE — Telephone Encounter (Signed)
Form signed.

## 2017-01-14 DIAGNOSIS — M25561 Pain in right knee: Secondary | ICD-10-CM | POA: Diagnosis not present

## 2017-01-14 DIAGNOSIS — Z6827 Body mass index (BMI) 27.0-27.9, adult: Secondary | ICD-10-CM | POA: Diagnosis not present

## 2017-01-14 DIAGNOSIS — M545 Low back pain: Secondary | ICD-10-CM | POA: Diagnosis not present

## 2017-01-28 DIAGNOSIS — M25561 Pain in right knee: Secondary | ICD-10-CM | POA: Diagnosis not present

## 2017-01-28 DIAGNOSIS — M62551 Muscle wasting and atrophy, not elsewhere classified, right thigh: Secondary | ICD-10-CM | POA: Diagnosis not present

## 2017-01-28 DIAGNOSIS — M25461 Effusion, right knee: Secondary | ICD-10-CM | POA: Diagnosis not present

## 2017-01-28 DIAGNOSIS — M25661 Stiffness of right knee, not elsewhere classified: Secondary | ICD-10-CM | POA: Diagnosis not present

## 2017-02-09 ENCOUNTER — Encounter: Payer: BLUE CROSS/BLUE SHIELD | Admitting: Family Medicine

## 2017-02-22 ENCOUNTER — Encounter: Payer: Self-pay | Admitting: Family Medicine

## 2017-03-29 ENCOUNTER — Encounter: Payer: Self-pay | Admitting: Family Medicine

## 2017-04-04 DIAGNOSIS — M25561 Pain in right knee: Secondary | ICD-10-CM | POA: Diagnosis not present

## 2017-04-04 DIAGNOSIS — R300 Dysuria: Secondary | ICD-10-CM | POA: Diagnosis not present

## 2017-04-04 DIAGNOSIS — R350 Frequency of micturition: Secondary | ICD-10-CM | POA: Diagnosis not present

## 2017-04-06 DIAGNOSIS — R102 Pelvic and perineal pain: Secondary | ICD-10-CM | POA: Diagnosis not present

## 2017-04-06 DIAGNOSIS — N83202 Unspecified ovarian cyst, left side: Secondary | ICD-10-CM | POA: Diagnosis not present

## 2017-04-08 DIAGNOSIS — S73191A Other sprain of right hip, initial encounter: Secondary | ICD-10-CM | POA: Diagnosis not present

## 2017-04-08 DIAGNOSIS — M6281 Muscle weakness (generalized): Secondary | ICD-10-CM | POA: Diagnosis not present

## 2017-04-08 DIAGNOSIS — M1711 Unilateral primary osteoarthritis, right knee: Secondary | ICD-10-CM | POA: Diagnosis not present

## 2017-04-08 DIAGNOSIS — M2241 Chondromalacia patellae, right knee: Secondary | ICD-10-CM | POA: Diagnosis not present

## 2017-05-06 DIAGNOSIS — G8929 Other chronic pain: Secondary | ICD-10-CM | POA: Diagnosis not present

## 2017-05-06 DIAGNOSIS — M25561 Pain in right knee: Secondary | ICD-10-CM | POA: Diagnosis not present

## 2017-05-11 ENCOUNTER — Encounter: Payer: Self-pay | Admitting: Family Medicine

## 2017-05-31 DIAGNOSIS — G8929 Other chronic pain: Secondary | ICD-10-CM | POA: Diagnosis not present

## 2017-05-31 DIAGNOSIS — M25561 Pain in right knee: Secondary | ICD-10-CM | POA: Diagnosis not present

## 2017-06-29 DIAGNOSIS — J019 Acute sinusitis, unspecified: Secondary | ICD-10-CM | POA: Diagnosis not present

## 2017-06-29 DIAGNOSIS — R05 Cough: Secondary | ICD-10-CM | POA: Diagnosis not present

## 2017-09-23 DIAGNOSIS — Z6827 Body mass index (BMI) 27.0-27.9, adult: Secondary | ICD-10-CM | POA: Diagnosis not present

## 2017-09-23 DIAGNOSIS — Z1231 Encounter for screening mammogram for malignant neoplasm of breast: Secondary | ICD-10-CM | POA: Diagnosis not present

## 2017-09-23 DIAGNOSIS — Z01419 Encounter for gynecological examination (general) (routine) without abnormal findings: Secondary | ICD-10-CM | POA: Diagnosis not present

## 2017-09-23 DIAGNOSIS — N950 Postmenopausal bleeding: Secondary | ICD-10-CM | POA: Diagnosis not present

## 2017-09-27 ENCOUNTER — Other Ambulatory Visit: Payer: Self-pay | Admitting: Family Medicine

## 2017-10-15 IMAGING — MR MR LUMBAR SPINE W/O CM
4 of 5 series · 20 of 48 positions shown · non-contrast
Comparison: Lumbar radiographs 12/21/2016. [REDACTED] CT Abdomen and Pelvis 03/30/2012. [HOSPITAL]
Chest CT 04/02/2005.

CLINICAL DATA: 50-year-old female with 1 year of lumbar back pain
but 3 weeks of acute centralized pain radiating to the right
posterior leg and foot with intermittent right lower extremity
numbness. No recent injury.

EXAM:
MRI LUMBAR SPINE WITHOUT CONTRAST
TECHNIQUE: Multiplanar, multisequence MR imaging of the lumbar spine was
performed. No intravenous contrast was administered.

[Series 6: T2 · sagittal · 4.0mm · 0.68mm/px · 6 of 15 slices shown (1 of 2)]
[im 1/15]
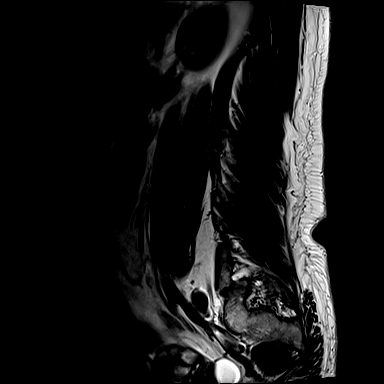
[im 3/15]
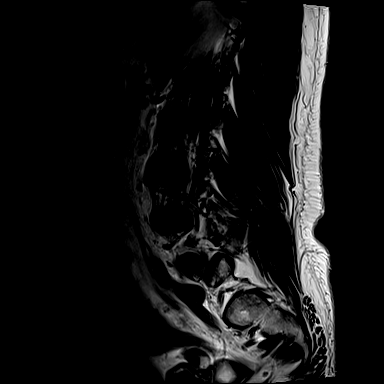
[im 6/15]
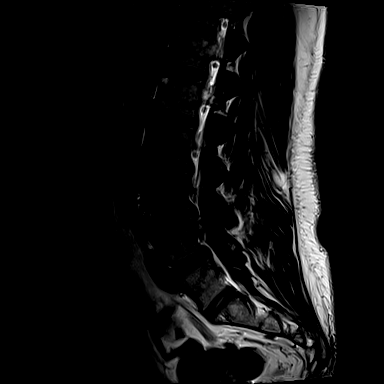
[im 9/15]
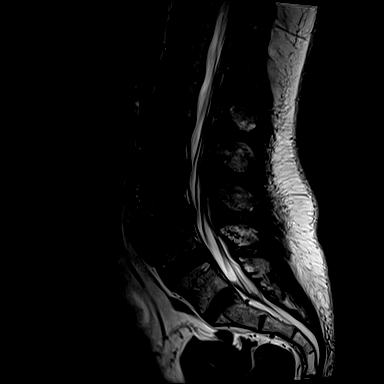
[im 12/15]
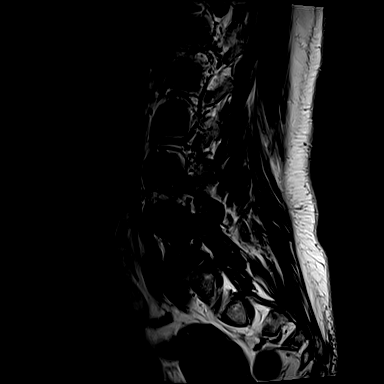
[im 15/15]
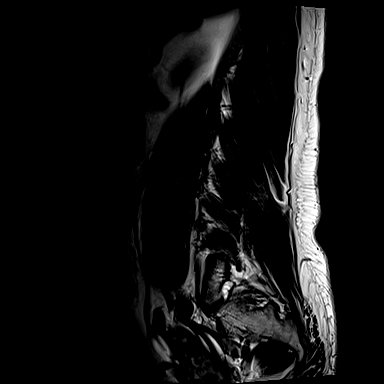

[Series 7: T1 · sagittal · 4.0mm · 0.68mm/px · 3 of 15 slices shown (1 of 2)]
[im 3/15]
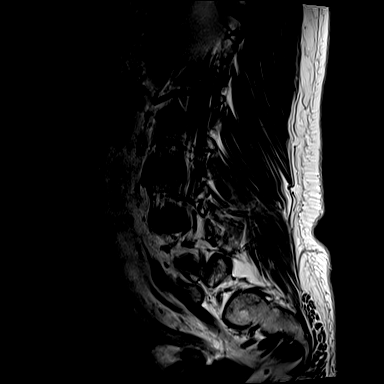
[im 8/15]
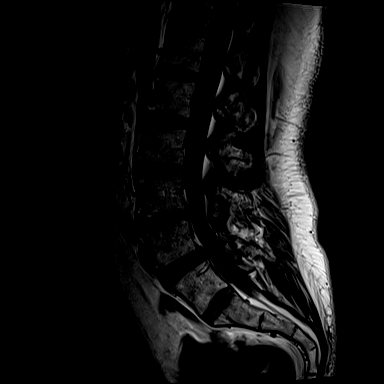
[im 12/15]
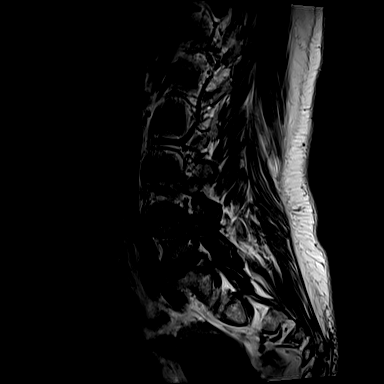

[Series 9: T1 · axial · 4.0mm · 0.56mm/px · z∈[-91,+41]mm · 3 of 31 slices shown (2 of 2)]
[im 5/31]
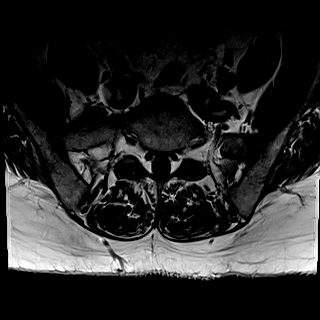
[im 17/31]
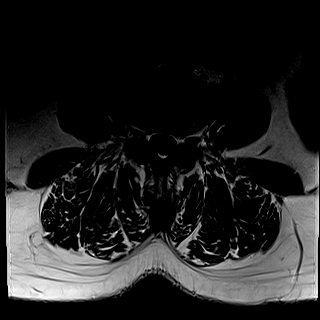
[im 26/31]
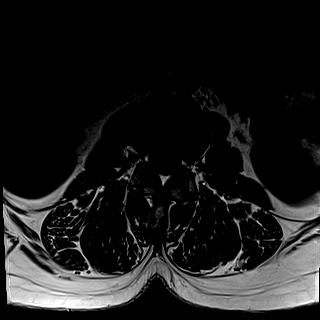

[Series 10: T2 · axial · 4.0mm · 0.28mm/px · z∈[-110,+66]mm · 8 of 31 slices shown (2 of 2)]
[im 1/31]
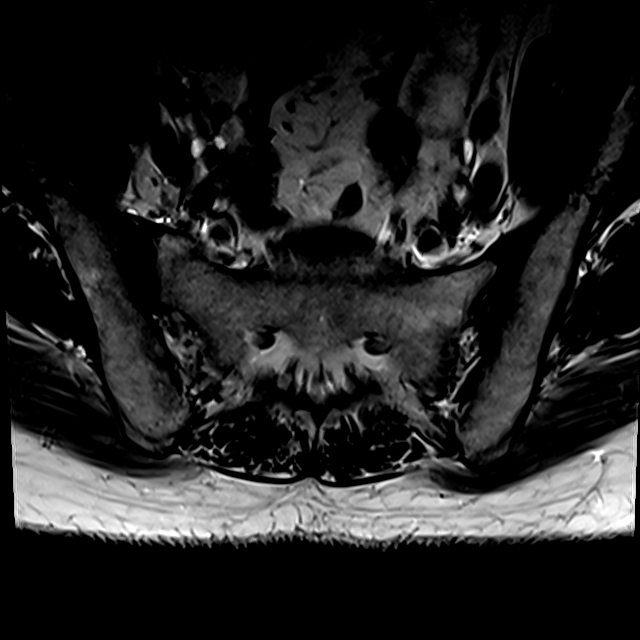
[im 5/31]
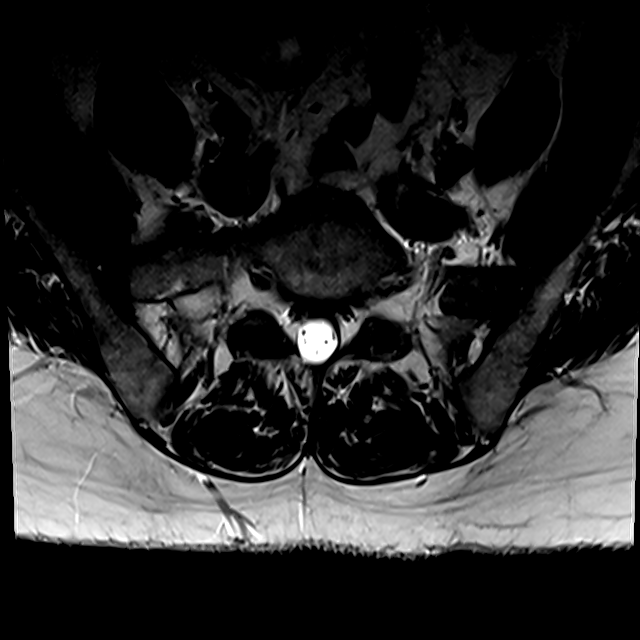
[im 10/31]
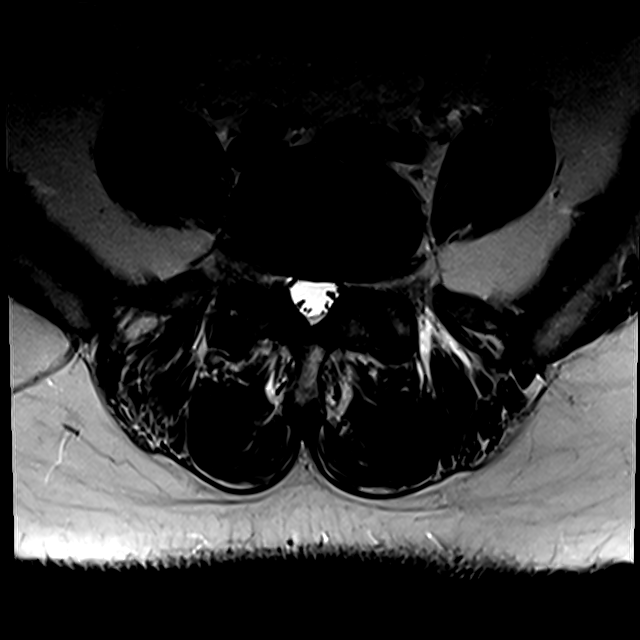
[im 14/31]
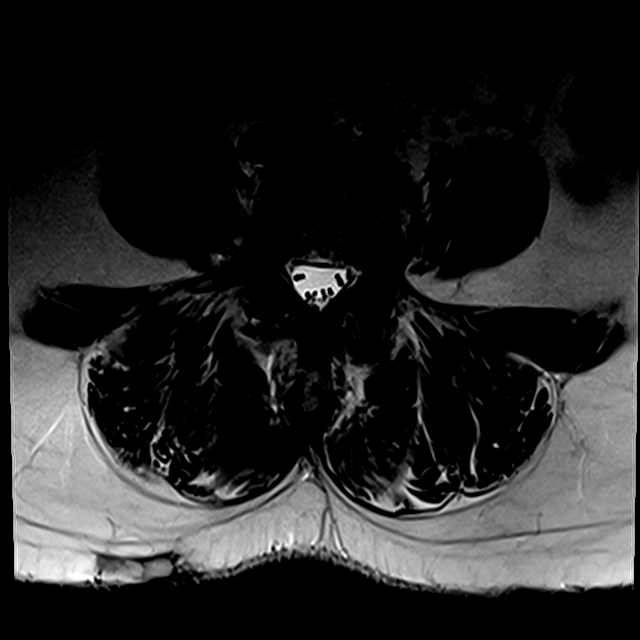
[im 17/31]
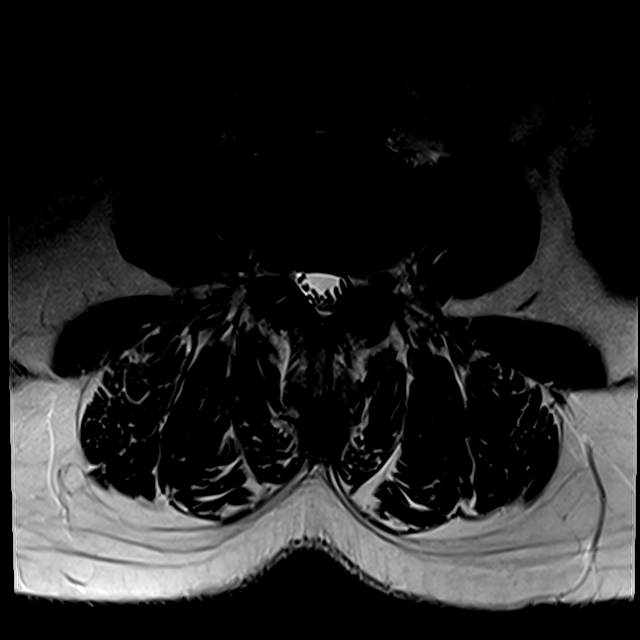
[im 21/31]
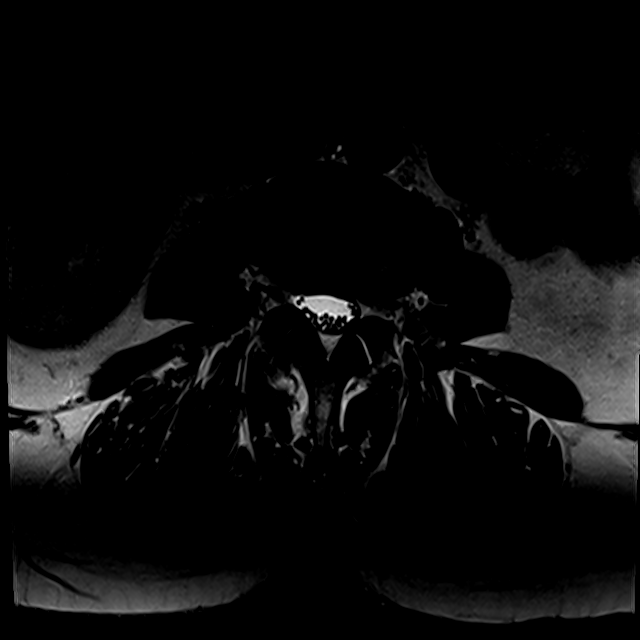
[im 26/31]
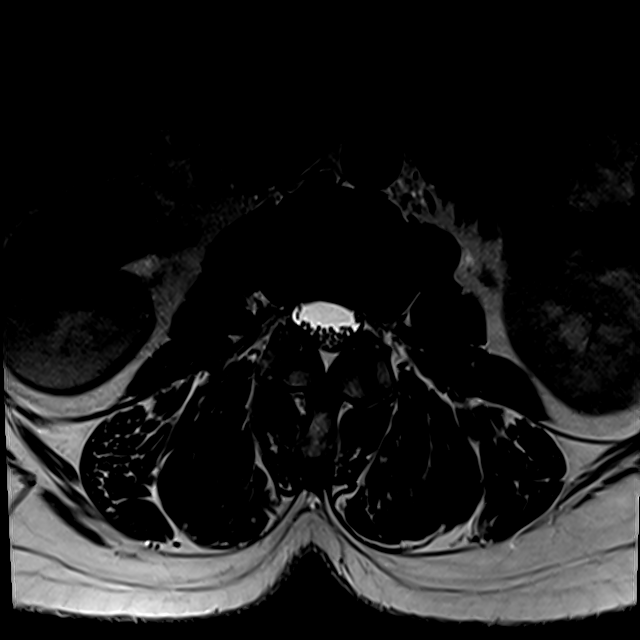
[im 31/31]
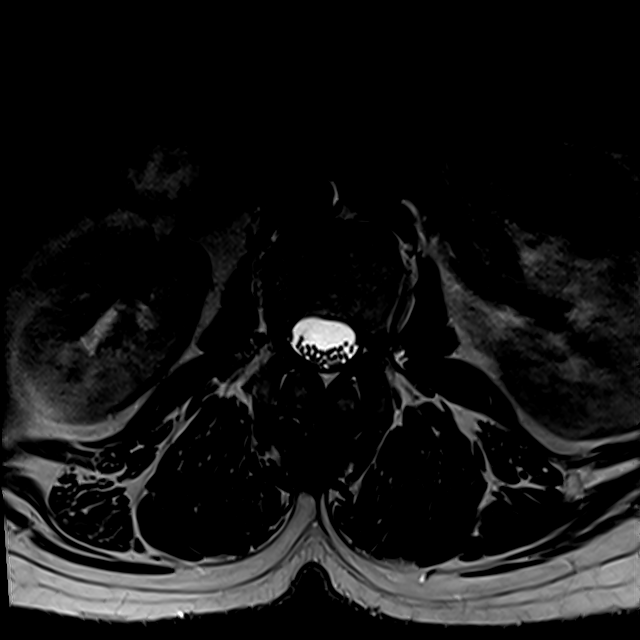

[20 of 48 positions shown; findings below may reference images not displayed]

FINDINGS: Segmentation: 12 full size ribs seen on the 8664 chest CT, therefore
the S1 level is partially lumbarized with a nearly full size S1-S2
disc space and bilateral assimilation joints, sclerotic on the right
as seen on the prior CT Abdomen and Pelvis. Correlation with
radiographs is recommended prior to any operative intervention.

Alignment:  Stable, mildly exaggerated lumbar lordosis.

Vertebrae: No marrow edema or evidence of acute osseous abnormality.
Visualized bone marrow signal is within normal limits. Visible
sacrum and SI joints are intact.

Conus medullaris: Extends to the L1 level and appears normal.

Paraspinal and other soft tissues: Multiple T2 and STIR dark
myometrial lesions are partially visible, the largest estimated at 2
cm diameter, suspected to be multiple small fibroids.

Visualized abdominal viscera and paraspinal soft tissues are within
normal limits.

Disc levels:

T12-L1:  Negative.

L1-L2:  Negative.

L2-L3:  Negative.

L3-L4: Disc desiccation. Mild disc space loss. Circumferential disc
bulge with broad-based posterior component. Borderline to mild facet
hypertrophy. No spinal or lateral recess stenosis. Borderline to
mild left L3 foraminal stenosis.

L4-L5: Disc desiccation and mild disc space loss. Circumferential
disc bulge with broad-based posterior component. Mild to moderate
facet hypertrophy with trace facet joint fluid. Borderline to mild
left L4 foraminal stenosis. No significant spinal or lateral recess
stenosis.

L5-S1: Mild disc desiccation. Circumferential disc bulge with
broad-based posterior component. Moderate facet hypertrophy greater
on the right. No spinal or lateral recess stenosis. Borderline to
mild bilateral L5 foraminal stenosis.

S1-S2: Partially lumbarized. No stenosis. Right side sclerotic
assimilation joint (series 9, image 28).
IMPRESSION: 1. Transitional anatomy with partially lumbarized S1 level.
Correlation with radiographs is recommended prior to any operative
intervention.
2. Lower lumbar disc and moderate facet degeneration L3-L4 through
L5-S1. Up to bilateral mild neural foraminal stenosis, but no
significant spinal or lateral recess stenosis.
3.  No acute osseous abnormality.
4. Fibroid uterus.

## 2017-11-08 DIAGNOSIS — R110 Nausea: Secondary | ICD-10-CM | POA: Diagnosis not present

## 2017-11-08 DIAGNOSIS — E1137X1 Type 2 diabetes mellitus with diabetic macular edema, resolved following treatment, right eye: Secondary | ICD-10-CM | POA: Diagnosis not present

## 2018-02-03 DIAGNOSIS — H9202 Otalgia, left ear: Secondary | ICD-10-CM | POA: Insufficient documentation

## 2018-02-03 DIAGNOSIS — K047 Periapical abscess without sinus: Secondary | ICD-10-CM | POA: Insufficient documentation

## 2018-02-03 DIAGNOSIS — F17200 Nicotine dependence, unspecified, uncomplicated: Secondary | ICD-10-CM | POA: Insufficient documentation

## 2018-12-13 LAB — HM DIABETES EYE EXAM

## 2018-12-16 ENCOUNTER — Encounter: Payer: Self-pay | Admitting: *Deleted

## 2019-05-18 ENCOUNTER — Encounter: Payer: Self-pay | Admitting: Family Medicine

## 2019-05-18 DIAGNOSIS — R109 Unspecified abdominal pain: Secondary | ICD-10-CM | POA: Diagnosis not present

## 2019-05-18 DIAGNOSIS — Z539 Procedure and treatment not carried out, unspecified reason: Secondary | ICD-10-CM | POA: Diagnosis not present

## 2019-05-18 DIAGNOSIS — E0590 Thyrotoxicosis, unspecified without thyrotoxic crisis or storm: Secondary | ICD-10-CM | POA: Diagnosis not present

## 2019-05-18 DIAGNOSIS — E119 Type 2 diabetes mellitus without complications: Secondary | ICD-10-CM | POA: Diagnosis not present

## 2019-06-15 ENCOUNTER — Encounter (INDEPENDENT_AMBULATORY_CARE_PROVIDER_SITE_OTHER): Payer: Self-pay

## 2019-06-15 ENCOUNTER — Encounter: Payer: Self-pay | Admitting: Gastroenterology

## 2019-06-15 ENCOUNTER — Ambulatory Visit (INDEPENDENT_AMBULATORY_CARE_PROVIDER_SITE_OTHER): Payer: BC Managed Care – PPO | Admitting: Gastroenterology

## 2019-06-15 VITALS — BP 122/70 | HR 72 | Temp 97.7°F | Ht 64.0 in | Wt 162.2 lb

## 2019-06-15 DIAGNOSIS — K5900 Constipation, unspecified: Secondary | ICD-10-CM

## 2019-06-15 DIAGNOSIS — K219 Gastro-esophageal reflux disease without esophagitis: Secondary | ICD-10-CM | POA: Diagnosis not present

## 2019-06-15 DIAGNOSIS — Z1211 Encounter for screening for malignant neoplasm of colon: Secondary | ICD-10-CM

## 2019-06-15 MED ORDER — PANTOPRAZOLE SODIUM 40 MG PO TBEC: 40.0000 mg | DELAYED_RELEASE_TABLET | Freq: Every day | ORAL | 3 refills | Status: DC

## 2019-06-15 NOTE — Progress Notes (Signed)
06/15/2019 Alexandra Randall [id number] 02-Feb-1966   HISTORY OF PRESENT ILLNESS: This is a 53 year old female who is new to our practice.  She was referred here by Shanon Rosser, PA-C, for evaluation regarding recent episode of constipation with abdominal pain and acid reflux.  She tells me that 3 weeks ago she had sudden onset of constipation.  Did not have a bowel movement for 3 days.  Tried taking milk of magnesia and Dulcolax.  And had severe abdominal pain and a lot of gas throughout her entire abdomen and into her back.  Was taking Gas-X, etc. for the gas.  Recent TSH, lipase, CBC, CMP, urinalysis were unremarkable.  She did finally move her bowels aft using Miralax for a couple of days.  She says that she changed her diet and stopped eating fatty/greasy foods.  Has been trying to eat more fiber including salads, vegetables, oatmeal, etc.  Has been moving her bowels well now.  Had been given samples of Linzess by her PCP, but not did not use those.  Has never had a colonoscopy in the past.  Denies any rectal bleeding.  Also reports issues with acid reflux.  Change in her diet has seemed to help that as well, but still having some heartburn and indigestion.  Not currently taking anything for this.   Past Medical History:  Diagnosis Date  . Allergic rhinitis   . Diabetes mellitus without complication ([place])   . Eczema   . GERD (gastroesophageal reflux disease)   . Graves disease   . Heart murmur   . Hyperlipidemia   . Hyperthyroidism   . Pansinusitis   . Sinusitis   . Vitamin D deficiency    Past Surgical History:  Procedure Laterality Date  . CHOLECYSTECTOMY    . MASS EXCISION Right 04/03/2014   Procedure: EXCISION MASS RIGHT LOWER BACK;  Surgeon: Harl Bowie, MD;  Location: [facility];  Service: General;  Laterality: Right;  . OVARIAN CYST REMOVAL      reports that she has been smoking cigarettes. She has a 10.00 pack-year smoking history. She has never used smokeless tobacco.  She reports current alcohol use. She reports that she does not use drugs. family history includes Cancer (age of onset: 50) in her mother; Cancer (age of onset: 60) in her father; Diabetes in her mother; Hypertension in her father; Prostate cancer in her father. Allergies  Allergen Reactions  . Hydrocodone Other (See Comments)    Heart palpitations       Outpatient Encounter Medications as of 06/15/2019  Medication Sig  . atorvastatin (LIPITOR) 40 MG tablet Take 40 mg by mouth daily.   . Blood Glucose Monitoring Suppl (ONE TOUCH ULTRA 2) w/Device KIT USE AS DIRECTED TO CHECK BLOOD SUGAR DAILY  . Cholecalciferol (VITAMIN D) 2000 units CAPS Take by mouth.  Marland Kitchen lisinopril-hydrochlorothiazide (PRINZIDE,ZESTORETIC) 20-12.5 MG tablet Take 1 tablet by mouth daily.  Marland Kitchen loratadine (CLARITIN) 10 MG tablet Take 10 mg by mouth daily.  . methimazole (TAPAZOLE) 5 MG tablet TAKE 1 TABLET BY MOUTH DAILY  . montelukast (SINGULAIR) 10 MG tablet Take 10 mg by mouth daily.   . ONE TOUCH ULTRA TEST test strip USE AS DIRECTED TO CHECK BLOOD SUGAR DAILY  . Probiotic Product (PROBIOTIC PO) Take by mouth daily.  . [DISCONTINUED] Biotin 1000 MCG tablet Take 1,000 mcg by mouth 3 (three) times daily.  . [DISCONTINUED] cyclobenzaprine (FLEXERIL) 10 MG tablet Take 1 tablet (10 mg total) by mouth 3 (three)  times daily as needed for muscle spasms.  . [DISCONTINUED] diazepam (VALIUM) 5 MG tablet Take 1 tablet (5 mg total) by mouth every 8 (eight) hours as needed (headache).  . [DISCONTINUED] diclofenac (VOLTAREN) 75 MG EC tablet Take 1 tablet (75 mg total) by mouth 2 (two) times daily.  . [DISCONTINUED] naproxen (NAPROSYN) 500 MG tablet Take 500 mg by mouth daily.   . [DISCONTINUED] traMADol (ULTRAM) 50 MG tablet Take 1 -2 every 8 hours as needed for pain.   No facility-administered encounter medications on file as of 06/15/2019.      REVIEW OF SYSTEMS  : All other systems reviewed and negative except where noted in the  History of Present Illness.   PHYSICAL EXAM: BP 122/70   Pulse 72   Temp 97.7 F (36.5 C)   Ht '5\' 4"'  (1.626 m)   Wt 162 lb 4 oz (73.6 kg)   BMI 27.85 kg/m  General: Well developed black female in no acute distress Head: Normocephalic and atraumatic Eyes:  Sclerae anicteric, conjunctiva pink. Ears: Normal auditory acuity Lungs: Clear throughout to auscultation; no increased WOB. Heart: Regular rate and rhythm; no M/R/G. Abdomen: Soft, non-distended.  BS present.  Non-tender. Rectal:  Will be done at the time of colonoscopy. Musculoskeletal: Symmetrical with no gross deformities  Skin: No lesions on visible extremities Extremities: No edema  Neurological: Alert oriented x 4, grossly non-focal Psychological:  Alert and cooperative. Normal mood and affect  ASSESSMENT AND PLAN: *Constipation: Had sudden onset severe episode with abdominal pain.  Changed diet and symptoms are much better.  We will continue with dietary changes for now, but advised can use MiraLAX as well and had been given Linzess samples by her PCP so she could use those in the future if needed. *CRC screening:  Never had colonoscopy in the past.  Will schedule with Dr. Carlean Purl. *GERD:  Improved with dietary changes, but still with some symptoms.  Will start pantoprazole 40 mg daily.  **The risks, benefits, and alternatives to colonoscopy were discussed with the patient and she consents to proceed.   CC:  Susy Frizzle, MD CC:  Shanon Rosser, PA-C

## 2019-06-15 NOTE — Patient Instructions (Signed)
We have sent the following medications to your pharmacy for you to pick up at your convenience: Pantoprazole.  You have been scheduled for a colonoscopy. Please follow written instructions given to you at your visit today.  Please pick up your prep supplies at the pharmacy within the next 1-3 days. If you use inhalers (even only as needed), please bring them with you on the day of your procedure.    

## 2019-06-16 DIAGNOSIS — K219 Gastro-esophageal reflux disease without esophagitis: Secondary | ICD-10-CM | POA: Insufficient documentation

## 2019-06-16 DIAGNOSIS — Z1211 Encounter for screening for malignant neoplasm of colon: Secondary | ICD-10-CM | POA: Insufficient documentation

## 2019-06-16 DIAGNOSIS — K5900 Constipation, unspecified: Secondary | ICD-10-CM | POA: Insufficient documentation

## 2019-06-23 ENCOUNTER — Encounter: Payer: BC Managed Care – PPO | Admitting: Internal Medicine

## 2019-07-11 ENCOUNTER — Encounter: Payer: Self-pay | Admitting: Internal Medicine

## 2019-07-14 ENCOUNTER — Telehealth: Payer: Self-pay | Admitting: Internal Medicine

## 2019-07-14 NOTE — Telephone Encounter (Signed)

## 2019-07-14 NOTE — Telephone Encounter (Signed)
Pt returned call and answered NO to all the Covid-19 screening questions °

## 2019-07-17 ENCOUNTER — Other Ambulatory Visit: Payer: Self-pay | Admitting: Internal Medicine

## 2019-07-17 ENCOUNTER — Ambulatory Visit (AMBULATORY_SURGERY_CENTER): Payer: BC Managed Care – PPO | Admitting: Internal Medicine

## 2019-07-17 ENCOUNTER — Encounter: Payer: Self-pay | Admitting: Internal Medicine

## 2019-07-17 ENCOUNTER — Other Ambulatory Visit: Payer: Self-pay

## 2019-07-17 VITALS — BP 112/70 | HR 60 | Temp 97.7°F | Resp 14 | Ht 64.0 in | Wt 162.0 lb

## 2019-07-17 DIAGNOSIS — K635 Polyp of colon: Secondary | ICD-10-CM

## 2019-07-17 DIAGNOSIS — Z1211 Encounter for screening for malignant neoplasm of colon: Secondary | ICD-10-CM

## 2019-07-17 MED ORDER — SODIUM CHLORIDE 0.9 % IV SOLN: 500.0000 mL | Freq: Once | INTRAVENOUS | Status: DC

## 2019-07-17 NOTE — Op Note (Signed)
[facility] Patient Name: Alexandra Randall Procedure Date: 07/17/2019 11:20 AM MRN: [id number] Endoscopist: Iva Booparl E Rin Gorton , MD Age: [age] Referring MD:  Date of Birth: 08-18-1966 Gender: Female Account #: [id number] Procedure:                Colonoscopy Indications:              Screening for colorectal malignant neoplasm, This                            is the patient's first colonoscopy Medicines:                Propofol per Anesthesia, Monitored Anesthesia Care Procedure:                Pre-Anesthesia Assessment:                           - Prior to the procedure, a History and Physical                            was performed, and patient medications and                            allergies were reviewed. The patient's tolerance of                            previous anesthesia was also reviewed. The risks                            and benefits of the procedure and the sedation                            options and risks were discussed with the patient.                            All questions were answered, and informed consent                            was obtained. Prior Anticoagulants: The patient has                            taken no previous anticoagulant or antiplatelet                            agents. ASA Grade Assessment: II - A patient with                            mild systemic disease. After reviewing the risks                            and benefits, the patient was deemed in                            satisfactory condition to undergo the procedure.  After obtaining informed consent, the colonoscope                            was passed under direct vision. Throughout the                            procedure, the patient's blood pressure, pulse, and                            oxygen saturations were monitored continuously. The                            Colonoscope was introduced through the anus and   advanced to the the cecum, identified by                            appendiceal orifice and ileocecal valve. The                            colonoscopy was performed without difficulty. The                            patient tolerated the procedure well. The quality                            of the bowel preparation was good. The bowel                            preparation used was Miralax via split dose                            instruction. The ileocecal valve, appendiceal                            orifice, and rectum were photographed. Scope In: 11:30:06 AM Scope Out: 11:48:12 AM Scope Withdrawal Time: 0 hours 11 minutes 41 seconds  Total Procedure Duration: 0 hours 18 minutes 6 seconds  Findings:                 The perianal and digital rectal examinations were                            normal.                           Two sessile polyps were found in the sigmoid colon.                            The polyps were diminutive in size. These polyps                            were removed with a cold snare. Resection and                            retrieval were complete. Verification of  patient                            identification for the specimen was done. Estimated                            blood loss was minimal.                           The exam was otherwise without abnormality on                            direct and retroflexion views. Complications:            No immediate complications. Estimated Blood Loss:     Estimated blood loss was minimal. Impression:               - Two diminutive polyps in the sigmoid colon,                            removed with a cold snare. Resected and retrieved.                           - The examination was otherwise normal on direct                            and retroflexion views. Recommendation:           - Patient has a contact number available for                            emergencies. The signs and symptoms of potential                             delayed complications were discussed with the                            patient. Return to normal activities tomorrow.                            Written discharge instructions were provided to the                            patient.                           - Resume previous diet.                           - Continue present medications.                           - Repeat colonoscopy is recommended. The                            colonoscopy date will be determined after pathology  results from today's exam become available for                            review. Iva Booparl E Alexandra Lamartina, MD 07/17/2019 11:57:17 AM This report has been signed electronically.

## 2019-07-17 NOTE — Progress Notes (Signed)
Pt's states no medical or surgical changes since previsit or office visit.  Temp KA VS CW 

## 2019-07-17 NOTE — Patient Instructions (Addendum)
I found and removed 2 tiny polyps.  No signs of cancer or anything bad.  I will let you know pathology results and when to have another routine colonoscopy by mail and/or My Chart.  I appreciate the opportunity to care for you. Gatha Mayer, MD, [facility]    Information on polyps given to you today  Await pathology results on polyps removed    YOU HAD AN ENDOSCOPIC PROCEDURE TODAY AT [place]:   Refer to the procedure report that was given to you for any specific questions about what was found during the examination.  If the procedure report does not answer your questions, please call your gastroenterologist to clarify.  If you requested that your care partner not be given the details of your procedure findings, then the procedure report has been included in a sealed envelope for you to review at your convenience later.  YOU SHOULD EXPECT: Some feelings of bloating in the abdomen. Passage of more gas than usual.  Walking can help get rid of the air that was put into your GI tract during the procedure and reduce the bloating. If you had a lower endoscopy (such as a colonoscopy or flexible sigmoidoscopy) you may notice spotting of blood in your stool or on the toilet paper. If you underwent a bowel prep for your procedure, you may not have a normal bowel movement for a few days.  Please Note:  You might notice some irritation and congestion in your nose or some drainage.  This is from the oxygen used during your procedure.  There is no need for concern and it should clear up in a day or so.  SYMPTOMS TO REPORT IMMEDIATELY:   Following lower endoscopy (colonoscopy or flexible sigmoidoscopy):  Excessive amounts of blood in the stool  Significant tenderness or worsening of abdominal pains  Swelling of the abdomen that is new, acute  Fever of 100F or higher    For urgent or emergent issues, a gastroenterologist can be reached at any hour by calling [phone]  [phone].   DIET:  We do recommend a small meal at first, but then you may proceed to your regular diet.  Drink plenty of fluids but you should avoid alcoholic beverages for 24 hours.  ACTIVITY:  You should plan to take it easy for the rest of today and you should NOT DRIVE or use heavy machinery until tomorrow (because of the sedation medicines used during the test).    FOLLOW UP: Our staff will call the number listed on your records 48-72 hours following your procedure to check on you and address any questions or concerns that you may have regarding the information given to you following your procedure. If we do not reach you, we will leave a message.  We will attempt to reach you two times.  During this call, we will ask if you have developed any symptoms of COVID 19. If you develop any symptoms (ie: fever, flu-like symptoms, shortness of breath, cough etc.) before then, please call [phone].  If you test positive for Covid 19 in the 2 weeks post procedure, please call and report this information to [place].    If any biopsies were taken you will be contacted by phone or by letter within the next 1-3 weeks.  Please call us at [phone] if you have not heard about the biopsies in 3 weeks.    SIGNATURES/CONFIDENTIALITY: You and/or your care partner have signed paperwork which will be entered  into your electronic medical record.  These signatures attest to the fact that that the information above on your After Visit Summary has been reviewed and is understood.  Full responsibility of the confidentiality of this discharge information lies with you and/or your care-partner.

## 2019-07-17 NOTE — Progress Notes (Signed)
To PACU, VSS. Report to [email or web address].tb 

## 2019-07-17 NOTE — Progress Notes (Signed)
Called to room to assist during endoscopic procedure.  Patient ID and intended procedure confirmed with present staff. Received instructions for my participation in the procedure from the performing physician.  

## 2019-07-19 ENCOUNTER — Telehealth: Payer: Self-pay

## 2019-07-19 ENCOUNTER — Telehealth: Payer: Self-pay | Admitting: Internal Medicine

## 2019-07-19 NOTE — Telephone Encounter (Signed)
Our records show that she has an Rx for pantoprazole  If that is being taken and not helpful let me know and I will Rx something else  If not she should take that as rxed by Alonza Bogus

## 2019-07-19 NOTE — Telephone Encounter (Signed)
  Follow up Call-  Call back number 07/17/2019  Post procedure Call Back phone  # [phone]  Permission to leave phone message Yes  Some recent data might be hidden     Patient questions:  Do you have a fever, pain , or abdominal swelling? No. Pain Score  0 *  Have you tolerated food without any problems? Yes.    Have you been able to return to your normal activities? Yes.    Do you have any questions about your discharge instructions: Diet   No. Medications  No. Follow up visit  No.  Do you have questions or concerns about your Care? No.  Actions: * If pain score is 4 or above: No action needed, pain <4.  1. Have you developed a fever since your procedure?no  2.   Have you had an respiratory symptoms (SOB or cough) since your procedure?no 3.   Have you tested positive for COVID 19 since your procedure no  4.   Have you had any family members/close contacts diagnosed with the COVID 19 since your procedure?  no   If yes to any of these questions please route to Joylene John, RN and Alphonsa Gin, Therapist, sports.

## 2019-07-19 NOTE — Telephone Encounter (Signed)
NO ANSWER, MESSAGE LEFT FOR PATIENT. 

## 2019-07-19 NOTE — Telephone Encounter (Signed)
Patient called back and said that she was feeling fine and everything was good.

## 2019-07-19 NOTE — Telephone Encounter (Signed)
Second post procedure follow up call, no answer 

## 2019-07-20 NOTE — Telephone Encounter (Signed)
Called patient to clarify and she never got the original Rx for the pantoprazole. [facility] use the [facility] [address]. Would you like for me to order that for her? Please advise.

## 2019-07-21 ENCOUNTER — Other Ambulatory Visit: Payer: Self-pay | Admitting: Internal Medicine

## 2019-07-21 MED ORDER — PANTOPRAZOLE SODIUM 40 MG PO TBEC: 40.0000 mg | DELAYED_RELEASE_TABLET | Freq: Every day | ORAL | 0 refills | Status: DC

## 2019-07-21 NOTE — Telephone Encounter (Signed)
Called patient and left her a message telling her that she had [facility].

## 2019-07-21 NOTE — Telephone Encounter (Signed)
I sent it in but sent to [facility] so please call them and cancel that but there is one at [facility]  Ask her to make a f/u visit for mid November please

## 2019-07-25 ENCOUNTER — Telehealth: Payer: Self-pay

## 2019-07-25 NOTE — Telephone Encounter (Signed)
I have started a prior authorization for pantoprazole thru [employer] , Dx GER[facility].  I left her a voice mail that I'm working on this for her.

## 2019-07-25 NOTE — Telephone Encounter (Signed)
Pt returned your call and I gave her your msg.

## 2019-07-26 NOTE — Telephone Encounter (Signed)
I have sent more information to [employer] as they requested.

## 2019-07-26 NOTE — Telephone Encounter (Signed)
Pantoprazole has been approved thru 07/25/2020. Pharmacy informed.

## 2019-07-26 NOTE — Telephone Encounter (Signed)
Left message of the approval on patient's voicemail.

## 2019-07-28 ENCOUNTER — Encounter: Payer: Self-pay | Admitting: Internal Medicine

## 2019-07-28 NOTE — Progress Notes (Signed)
Sigmoid diminutive hyperplastic polyps Recall 2030

## 2019-07-31 ENCOUNTER — Telehealth: Payer: Self-pay | Admitting: Internal Medicine

## 2019-07-31 NOTE — Telephone Encounter (Signed)
Pt inquired about pathology results.  

## 2019-08-01 NOTE — Telephone Encounter (Signed)
Patient notified of the results and recommendations.  All questions answered.  She will call back for any additional questions or concerns.

## 2019-08-09 DIAGNOSIS — M79642 Pain in left hand: Secondary | ICD-10-CM | POA: Diagnosis not present

## 2019-08-18 DIAGNOSIS — R200 Anesthesia of skin: Secondary | ICD-10-CM | POA: Diagnosis not present

## 2019-08-18 DIAGNOSIS — M25532 Pain in left wrist: Secondary | ICD-10-CM | POA: Diagnosis not present

## 2019-08-18 DIAGNOSIS — M542 Cervicalgia: Secondary | ICD-10-CM | POA: Diagnosis not present

## 2019-08-18 DIAGNOSIS — M7989 Other specified soft tissue disorders: Secondary | ICD-10-CM | POA: Diagnosis not present

## 2019-08-18 HISTORY — DX: Pain in left wrist: M25.532

## 2019-08-22 ENCOUNTER — Other Ambulatory Visit: Payer: Self-pay | Admitting: Orthopedic Surgery

## 2019-08-22 DIAGNOSIS — M7989 Other specified soft tissue disorders: Secondary | ICD-10-CM

## 2019-09-01 ENCOUNTER — Other Ambulatory Visit: Payer: BC Managed Care – PPO

## 2019-11-02 ENCOUNTER — Other Ambulatory Visit: Payer: Self-pay

## 2019-11-02 MED ORDER — PANTOPRAZOLE SODIUM 40 MG PO TBEC: 40.0000 mg | DELAYED_RELEASE_TABLET | Freq: Every day | ORAL | 1 refills | Status: DC

## 2019-11-02 NOTE — Telephone Encounter (Signed)
Pantoprazole refilled. 

## 2020-01-19 DIAGNOSIS — M1711 Unilateral primary osteoarthritis, right knee: Secondary | ICD-10-CM | POA: Diagnosis not present

## 2020-01-19 DIAGNOSIS — M25561 Pain in right knee: Secondary | ICD-10-CM | POA: Insufficient documentation

## 2020-02-16 ENCOUNTER — Encounter: Payer: Self-pay | Admitting: Family Medicine

## 2020-03-04 ENCOUNTER — Ambulatory Visit: Payer: BC Managed Care – PPO | Admitting: Family Medicine

## 2020-03-11 ENCOUNTER — Other Ambulatory Visit: Payer: Self-pay

## 2020-03-11 ENCOUNTER — Ambulatory Visit (INDEPENDENT_AMBULATORY_CARE_PROVIDER_SITE_OTHER): Payer: BC Managed Care – PPO | Admitting: Family Medicine

## 2020-03-11 ENCOUNTER — Encounter: Payer: Self-pay | Admitting: Family Medicine

## 2020-03-11 VITALS — BP 124/60 | HR 71 | Wt 164.0 lb

## 2020-03-11 DIAGNOSIS — E1159 Type 2 diabetes mellitus with other circulatory complications: Secondary | ICD-10-CM

## 2020-03-11 DIAGNOSIS — R3589 Other polyuria: Secondary | ICD-10-CM

## 2020-03-11 DIAGNOSIS — Z7689 Persons encountering health services in other specified circumstances: Secondary | ICD-10-CM | POA: Diagnosis not present

## 2020-03-11 DIAGNOSIS — M546 Pain in thoracic spine: Secondary | ICD-10-CM

## 2020-03-11 DIAGNOSIS — L84 Corns and callosities: Secondary | ICD-10-CM | POA: Diagnosis not present

## 2020-03-11 DIAGNOSIS — R350 Frequency of micturition: Secondary | ICD-10-CM | POA: Diagnosis not present

## 2020-03-11 DIAGNOSIS — E119 Type 2 diabetes mellitus without complications: Secondary | ICD-10-CM | POA: Diagnosis not present

## 2020-03-11 DIAGNOSIS — I152 Hypertension secondary to endocrine disorders: Secondary | ICD-10-CM

## 2020-03-11 DIAGNOSIS — E0500 Thyrotoxicosis with diffuse goiter without thyrotoxic crisis or storm: Secondary | ICD-10-CM

## 2020-03-11 DIAGNOSIS — R358 Other polyuria: Secondary | ICD-10-CM

## 2020-03-11 DIAGNOSIS — I10 Essential (primary) hypertension: Secondary | ICD-10-CM

## 2020-03-11 DIAGNOSIS — E785 Hyperlipidemia, unspecified: Secondary | ICD-10-CM

## 2020-03-11 LAB — POCT GLYCOSYLATED HEMOGLOBIN (HGB A1C): Hemoglobin A1C: 5.6 % (ref 4.0–5.6)

## 2020-03-11 LAB — POCT URINALYSIS DIP (MANUAL ENTRY)
Bilirubin, UA: NEGATIVE
Blood, UA: NEGATIVE
Glucose, UA: NEGATIVE mg/dL
Ketones, POC UA: NEGATIVE mg/dL
Leukocytes, UA: NEGATIVE
Nitrite, UA: NEGATIVE
Protein Ur, POC: NEGATIVE mg/dL
Spec Grav, UA: 1.02 (ref 1.010–1.025)
Urobilinogen, UA: 0.2 E.U./dL
pH, UA: 6 (ref 5.0–8.0)

## 2020-03-11 NOTE — Patient Instructions (Signed)
It is wonderful to meet you today.  We will be checking your cholesterol, diabetes, thyroid, and liver/kidney function, however these will be back by the end of this week.  Depending on what your labs show, I most likely will get you back very quickly to discuss your diabetes.  Continue monitoring your sugars at home and keeping a log of these.   I have also placed a referral to podiatry for you to see if they can help you with your calluses on your foot.  Lastly, for your back make sure that you are doing range of motion exercises usually when you wake up in the morning and before bedtime.  I have also provided you with some general strengthening exercises.

## 2020-03-11 NOTE — Progress Notes (Signed)
SUBJECTIVE:   CHIEF COMPLAINT / HPI: Establish care  Alexandra Randall is a 54 year old female presenting to establish care and discuss the following:  Hasn't seen a PCP or doctor since 2019. She has worsening calluses on right foot and would like to see a foot doctor to discuss these. Frequent urination for a few months, no dysuria. Increased thirst. Last week her upper right back was hurting around her bra line, but much better now after NSAIDs, rest, and heat. Non-traumatic. No bowel/bladder incontinence, saddle anesthesia, numbness/tingling, or associated weakness.  Social history: Lives with her son and significant other, fiance, who she has been with for the past 15-16 years.  Has a dog, part chihuahua.  Enjoys going out to dinner, shopping, and going to the beach.  Currently smokes cigarettes.  Denies any illicit drug use.  Drinks beer on occasion.  Denies any concern for STIs and feels safe in her relationships.  Past medical history:  Diabetes: last a1c in 2017 was 5.7. Diet controlled for now.  Diagnosed around 2005. Has checked her glucose occasionally, has been anywhere from 40 to 200's.   Graves' disease on methimazole  Hypertension  Past surgical history: Cholecystectomy in 2004  Care team: Ob/gyn: Dr. Jennette Kettle, [facility]  Does not have a Endocrinologist  Medications: Methimazole 5 mg daily Lisinopril-HCTZ 20-12.5 mg daily Loratadine 10 mg daily Atorvastatin 40 mg daily Stool softener 2 every day  Family history: Mother had thyroid cancer Diabetes and hypertension run in the family  Preventative care: Colonoscopy in 06/2019 and found 2 polyps at that time, got this at [facility], due in 10 years  Mammogram in 02/2018 Pap smear in 02/2018  OBJECTIVE:   BP 124/60   Pulse 71   Wt 164 lb (74.4 kg)   LMP 10/16/2016 (Approximate)   SpO2 98%   BMI 28.15 kg/m   General: Alert, NAD HEENT: NCAT, MMM Cardiac: RRR no m/g/r Lungs: Clear bilaterally, no increased WOB   Abdomen: soft, non-tender, non-distended, normoactive BS Msk: Moves all extremities spontaneously, no deformities, bruising, or rash overlying thoracic/lumbar spine/right upper back.  Nontender to palpation of thoracic and lumbar spinous processes and rib angles bilaterally.  Decreased ROM with rotation to the right, otherwise full ROM through thoracic/lumbar spine. Right foot: Two 1cm callus present around 1st/5th metatarsal phalangeal joints on sole of foot.  Ext: Warm, dry  ASSESSMENT/PLAN:   Encounter to establish care with new doctor Reviewed past medical, surgical, and social history.  Medications updated in epic.  Health maintenance reviewed and had patient sign ROI to receive recent Pap smear and mammogram results. Encouraged working towards a well-balanced diet and staying physically active.  Type 2 diabetes mellitus without complications (HCC) Last A1c 5.7 in 2017, currently diet controlled. Do have some concerns for worsening of her diabetes given report of increasing polyuria and polydipsia, however A1c collected after visit only 5.6. Fortunately, will correlate with pending CMP and have her continue to periodically measure CBGs at home to assess if A1c is inaccurate.  Graves disease Recheck TSH.  On methimazole 5 mg daily.  Hyperlipidemia On atorvastatin for primary prevention.  Check lipid panel today.  Foot callus Right foot.  Referral placed to [facility] per patient request.   Thoracic back pain Substantially improved, suspect MSK.  May be related incorrect bra banding.  Discussed continued conservative measures with Tylenol/heat as needed.  Demonstrated few ROM exercises for her thoracic/lumbar spine and provided with back strengthening exercises to do at home.  Follow-up  if not improving or sooner if worsening.  Polyuria Pending further diabetic evaluation as above.  U/a without evidence of infection.  Will discuss further on follow-up, could consider urge/stress  incontinence contributing.   Hypertension associated with diabetes ([place]) Well-controlled, continue lisinopril-HCTZ as is.    Follow-up pending labs, likely the next several weeks.  Patriciaann Clan, [place]

## 2020-03-12 ENCOUNTER — Encounter: Payer: Self-pay | Admitting: Family Medicine

## 2020-03-12 DIAGNOSIS — R3589 Other polyuria: Secondary | ICD-10-CM | POA: Insufficient documentation

## 2020-03-12 DIAGNOSIS — I152 Hypertension secondary to endocrine disorders: Secondary | ICD-10-CM | POA: Insufficient documentation

## 2020-03-12 DIAGNOSIS — M546 Pain in thoracic spine: Secondary | ICD-10-CM

## 2020-03-12 DIAGNOSIS — L84 Corns and callosities: Secondary | ICD-10-CM | POA: Insufficient documentation

## 2020-03-12 DIAGNOSIS — Z7689 Persons encountering health services in other specified circumstances: Secondary | ICD-10-CM | POA: Insufficient documentation

## 2020-03-12 DIAGNOSIS — E119 Type 2 diabetes mellitus without complications: Secondary | ICD-10-CM | POA: Insufficient documentation

## 2020-03-12 HISTORY — DX: Pain in thoracic spine: M54.6

## 2020-03-12 LAB — COMPREHENSIVE METABOLIC PANEL
ALT: 18 IU/L (ref 0–32)
AST: 17 IU/L (ref 0–40)
Albumin/Globulin Ratio: 1.4 (ref 1.2–2.2)
Albumin: 4.4 g/dL (ref 3.8–4.9)
Alkaline Phosphatase: 113 IU/L (ref 48–121)
BUN/Creatinine Ratio: 22 (ref 9–23)
BUN: 15 mg/dL (ref 6–24)
Bilirubin Total: 0.2 mg/dL (ref 0.0–1.2)
CO2: 20 mmol/L (ref 20–29)
Calcium: 9.4 mg/dL (ref 8.7–10.2)
Chloride: 99 mmol/L (ref 96–106)
Creatinine, Ser: 0.68 mg/dL (ref 0.57–1.00)
GFR calc Af Amer: 115 mL/min/{1.73_m2} (ref >59)
GFR calc non Af Amer: 100 mL/min/{1.73_m2} (ref >59)
Globulin, Total: 3.2 g/dL (ref 1.5–4.5)
Glucose: 88 mg/dL (ref 65–99)
Potassium: 4.6 mmol/L (ref 3.5–5.2)
Sodium: 134 mmol/L (ref 134–144)
Total Protein: 7.6 g/dL (ref 6.0–8.5)

## 2020-03-12 LAB — LIPID PANEL
Chol/HDL Ratio: 3.6 ratio (ref 0.0–4.4)
Cholesterol, Total: 224 mg/dL — ABNORMAL HIGH (ref 100–199)
HDL: 63 mg/dL (ref >39)
LDL Chol Calc (NIH): 144 mg/dL — ABNORMAL HIGH (ref 0–99)
Triglycerides: 95 mg/dL (ref 0–149)
VLDL Cholesterol Cal: 17 mg/dL (ref 5–40)

## 2020-03-12 LAB — TSH: TSH: 0.632 u[IU]/mL (ref 0.450–4.500)

## 2020-03-12 NOTE — Assessment & Plan Note (Signed)
Substantially improved, suspect MSK.  May be related incorrect bra banding.  Discussed continued conservative measures with Tylenol/heat as needed.  Demonstrated few ROM exercises for her thoracic/lumbar spine and provided with back strengthening exercises to do at home.  Follow-up if not improving or sooner if worsening.

## 2020-03-12 NOTE — Assessment & Plan Note (Signed)
Recheck TSH.  On methimazole 5 mg daily.

## 2020-03-12 NOTE — Assessment & Plan Note (Signed)
Well-controlled, continue lisinopril-HCTZ as is.

## 2020-03-12 NOTE — Assessment & Plan Note (Signed)
On atorvastatin for primary prevention.  Check lipid panel today.

## 2020-03-12 NOTE — Assessment & Plan Note (Addendum)
Last A1c 5.7 in 2017, currently diet controlled. Do have some concerns for worsening of her diabetes given report of increasing polyuria and polydipsia, however A1c collected after visit only 5.6. Fortunately, will correlate with pending CMP and have her continue to periodically measure CBGs at home to assess if A1c is inaccurate.

## 2020-03-12 NOTE — Assessment & Plan Note (Signed)
Pending further diabetic evaluation as above.  U/a without evidence of infection.  Will discuss further on follow-up, could consider urge/stress incontinence contributing.

## 2020-03-12 NOTE — Assessment & Plan Note (Signed)
Reviewed past medical, surgical, and social history.  Medications updated in epic.  Health maintenance reviewed and had patient sign ROI to receive recent Pap smear and mammogram results. Encouraged working towards a well-balanced diet and staying physically active.

## 2020-03-12 NOTE — Assessment & Plan Note (Signed)
Right foot.  Referral placed to [facility] per patient request.

## 2020-04-23 ENCOUNTER — Encounter: Payer: Self-pay | Admitting: Sports Medicine

## 2020-04-23 ENCOUNTER — Ambulatory Visit (INDEPENDENT_AMBULATORY_CARE_PROVIDER_SITE_OTHER): Payer: BC Managed Care – PPO | Admitting: Sports Medicine

## 2020-04-23 ENCOUNTER — Ambulatory Visit (INDEPENDENT_AMBULATORY_CARE_PROVIDER_SITE_OTHER): Payer: BC Managed Care – PPO

## 2020-04-23 ENCOUNTER — Other Ambulatory Visit: Payer: Self-pay

## 2020-04-23 VITALS — BP 107/73 | HR 72 | Temp 97.6°F | Resp 16

## 2020-04-23 DIAGNOSIS — R300 Dysuria: Secondary | ICD-10-CM | POA: Insufficient documentation

## 2020-04-23 DIAGNOSIS — M79671 Pain in right foot: Secondary | ICD-10-CM | POA: Diagnosis not present

## 2020-04-23 DIAGNOSIS — R112 Nausea with vomiting, unspecified: Secondary | ICD-10-CM | POA: Insufficient documentation

## 2020-04-23 DIAGNOSIS — M779 Enthesopathy, unspecified: Secondary | ICD-10-CM | POA: Diagnosis not present

## 2020-04-23 DIAGNOSIS — J329 Chronic sinusitis, unspecified: Secondary | ICD-10-CM | POA: Insufficient documentation

## 2020-04-23 DIAGNOSIS — M722 Plantar fascial fibromatosis: Secondary | ICD-10-CM

## 2020-04-23 DIAGNOSIS — R5383 Other fatigue: Secondary | ICD-10-CM | POA: Insufficient documentation

## 2020-04-23 DIAGNOSIS — M21619 Bunion of unspecified foot: Secondary | ICD-10-CM

## 2020-04-23 DIAGNOSIS — E559 Vitamin D deficiency, unspecified: Secondary | ICD-10-CM | POA: Insufficient documentation

## 2020-04-23 DIAGNOSIS — R8290 Unspecified abnormal findings in urine: Secondary | ICD-10-CM | POA: Insufficient documentation

## 2020-04-23 DIAGNOSIS — L84 Corns and callosities: Secondary | ICD-10-CM | POA: Diagnosis not present

## 2020-04-23 DIAGNOSIS — E119 Type 2 diabetes mellitus without complications: Secondary | ICD-10-CM | POA: Diagnosis not present

## 2020-04-23 DIAGNOSIS — J0140 Acute pansinusitis, unspecified: Secondary | ICD-10-CM | POA: Insufficient documentation

## 2020-04-23 MED ORDER — MELOXICAM 15 MG PO TABS: 15.0000 mg | ORAL_TABLET | Freq: Every day | ORAL | 0 refills | Status: DC

## 2020-04-23 MED ORDER — ACETAMINOPHEN-CODEINE #3 300-30 MG PO TABS: 1.0000 | ORAL_TABLET | Freq: Three times a day (TID) | ORAL | 0 refills | Status: AC | PRN

## 2020-04-23 NOTE — Progress Notes (Signed)
Subjective: Alexandra Randall is a 54 y.o. female patient who presents to office for evaluation of right foot pain. Patient complains of progressive pain especially over the last 6 months and the right foot.  Patient reports she has pain at the callus on the bottom of her right foot sometimes the lesion gets so painful that she can barely walk this pain at the callus has been going on for several years gradually getting worse and makes her whole foot sore but there is new pain at the bottom of the right heel in the bunion for the last 6 months that is off and on last Friday could barely walk due to the pain on her heel and that the bunion but pushed through working 12-hour shift.  Patient reports that she wears steel toed shoes for work and has to do a lot of standing and walking and sometimes the pain is unbearable.  Patient denies injury/trip/fall/sprain/any causative factors.  Patient is assisted by son's visit.  Fasting blood sugar this morning not recorded by last A1c 5.6  Review of Systems  All other systems reviewed and are negative.    Patient Active Problem List   Diagnosis Date Noted  . Acute pansinusitis 04/23/2020  . Diabetes mellitus ([place]) 04/23/2020  . Dysuria 04/23/2020  . Fatigue 04/23/2020  . Nausea and vomiting 04/23/2020  . Sinusitis 04/23/2020  . Urine finding 04/23/2020  . Vitamin D deficiency 04/23/2020  . Encounter to establish care with new doctor 03/12/2020  . Type 2 diabetes mellitus without complications ([place]) 94/17/4081  . Foot callus 03/12/2020  . Thoracic back pain 03/12/2020  . Polyuria 03/12/2020  . Hypertension associated with diabetes ([place]) 03/12/2020  . Pain in right knee 01/19/2020  . Left wrist pain 08/18/2019  . Neck pain 08/18/2019  . Swelling of extremity, left 08/18/2019  . Gastroesophageal reflux disease 06/16/2019  . Dental infection 02/03/2018  . Left ear pain 02/03/2018  . Smokes 02/03/2018  . Disorder of intervertebral disc of lumbar  spine 01/11/2017  . Allergic rhinitis 08/13/2016  . Hyperlipidemia   . Graves disease     Current Outpatient Medications on File Prior to Visit  Medication Sig Dispense Refill  . atorvastatin (LIPITOR) 40 MG tablet Take 40 mg by mouth daily.     . Blood Glucose Monitoring Suppl (ONE TOUCH ULTRA 2) w/Device KIT USE AS DIRECTED TO CHECK BLOOD SUGAR DAILY  0  . Cholecalciferol (VITAMIN D) 2000 units CAPS Take by mouth.    Marland Kitchen lisinopril-hydrochlorothiazide (PRINZIDE,ZESTORETIC) 20-12.5 MG tablet Take 1 tablet by mouth daily.  1  . loratadine (CLARITIN) 10 MG tablet Take 10 mg by mouth daily.    . methimazole (TAPAZOLE) 5 MG tablet Take 5 mg by mouth daily.    . ONE TOUCH ULTRA TEST test strip USE AS DIRECTED TO CHECK BLOOD SUGAR DAILY  6  . Probiotic Product (PROBIOTIC PO) Take by mouth daily.     No current facility-administered medications on file prior to visit.    Allergies  Allergen Reactions  . Hydrocodone Other (See Comments)    Heart palpitations     Objective:  General: Alert and oriented x3 in no acute distress  Dermatology: Keratotic lesion submet 4 on right foot with no acute signs of infection, no ecchymosis bilateral, all nails x 10 are well manicured.  Vascular: Dorsalis Pedis and Posterior Tibial pedal pulses palpable, Capillary Fill Time 3 seconds,(+) pedal hair growth bilateral, minimal edema bilateral lower extremities, Temperature gradient within normal limits.  Neurology: Johney Maine sensation intact via light touch bilateral.  Musculoskeletal: Moderate tenderness to palpation along the plantar fascial insertion greater than the Achilles insertion on the right heel, no palpable dell Thompson sign negative on right, there is also pain to palpation to medial eminence at bunion on right with guarding and limitation with range of motion at right first metatarsophalangeal joint, ankle range of motion is limited with increased pain upon dorsiflexion of the right foot.  Pes  planus foot type with above-mentioned bunion and hammertoe deformity.  Gait: Antalgic gait  Xrays  Right foot   Impression: Bunion and lesser hammertoe deformity, minimal calcaneal heel spur, mild soft tissue swelling, no other acute findings.  Assessment and Plan: Problem List Items Addressed This Visit      Endocrine   Type 2 diabetes mellitus without complications ([place])    Other Visit Diagnoses    Callus of foot    -  Primary   Plantar fasciitis       Relevant Orders   DG Foot Complete Right (Completed)   Tendinitis       Right foot pain       Bunion           -Complete examination performed -Xrays reviewed -Discussed treatement options for the right foot pain secondary to callus and likely acute on chronic tendinitis and fasciitis with bunion -Advised patient that her bunion and hammertoes are separate issue that may require surgery however at this time due to her other complaints that are more painful recommend on getting those better before considering any type of surgery for her bunions or hammertoes -Dispensed cam boot for patient to wear for the next 2 weeks to allow protection and offloading of right foot -Since patient was painful all over elected at this time to treat patient with oral medication instead of injection therapy -Prescribed meloxicam for patient to take once daily for inflammation and Tylenol with codeine for pain -Recommend rest ice elevation until symptoms improve -Recommend patient to be out of work since she cannot work with cam boot until her next office visit; patient to have FMLA or short-term disability paperwork since office -Mechanically debrided callus on the right foot submet 4 using a sterile chisel blade without incident -Patient to return to office as scheduled in 2 weeks or sooner if condition worsens.  Landis Martins, DPM

## 2020-04-23 NOTE — Patient Instructions (Signed)
Bunion  A bunion is a bump on the base of the big toe that forms when the bones of the big toe joint move out of position. Bunions may be small at first, but they often get larger over time. They can make walking painful. What are the causes? A bunion may be caused by:  Wearing narrow or pointed shoes that force the big toe to press against the other toes.  Abnormal foot development that causes the foot to roll inward (pronate).  Changes in the foot that are caused by certain diseases, such as rheumatoid arthritis or polio.  A foot injury. What increases the risk? The following factors may make you more likely to develop this condition:  Wearing shoes that squeeze the toes together.  Having certain diseases, such as: ? Rheumatoid arthritis. ? Polio. ? Cerebral palsy.  Having family members who have bunions.  Being born with a foot deformity, such as flat feet or low arches.  Doing activities that put a lot of pressure on the feet, such as ballet dancing. What are the signs or symptoms? The main symptom of a bunion is a noticeable bump on the big toe. Other symptoms may include:  Pain.  Swelling around the big toe.  Redness and inflammation.  Thick or hardened skin on the big toe or between the toes.  Stiffness or loss of motion in the big toe.  Trouble with walking. How is this diagnosed? A bunion may be diagnosed based on your symptoms, medical history, and activities. You may have tests, such as:  X-rays. These allow your health care provider to check the position of the bones in your foot and look for damage to your joint. They also help your health care provider determine the severity of your bunion and the best way to treat it.  Joint aspiration. In this test, a sample of fluid is removed from the toe joint. This test may be done if you are in a lot of pain. It helps rule out diseases that cause painful swelling of the joints, such as arthritis. How is this  treated? Treatment depends on the severity of your symptoms. The goal of treatment is to relieve symptoms and prevent the bunion from getting worse. Your health care provider may recommend:  Wearing shoes that have a wide toe box.  Using bunion pads to cushion the affected area.  Taping your toes together to keep them in a normal position.  Placing a device inside your shoe (orthotics) to help reduce pressure on your toe joint.  Taking medicine to ease pain, inflammation, and swelling.  Applying heat or ice to the affected area.  Doing stretching exercises.  Surgery to remove scar tissue and move the toes back into their normal position. This treatment is rare. Follow these instructions at home: Managing pain, stiffness, and swelling   If directed, put ice on the painful area: ? Put ice in a plastic bag. ? Place a towel between your skin and the bag. ? Leave the ice on for 20 minutes, 2-3 times a day. Activity   If directed, apply heat to the affected area before you exercise. Use the heat source that your health care provider recommends, such as a moist heat pack or a heating pad. ? Place a towel between your skin and the heat source. ? Leave the heat on for 20-30 minutes. ? Remove the heat if your skin turns bright red. This is especially important if you are unable to feel pain,   heat, or cold. You may have a greater risk of getting burned.  Do exercises as told by your health care provider. General instructions  Support your toe joint with proper footwear, shoe padding, or taping as told by your health care provider.  Take over-the-counter and prescription medicines only as told by your health care provider.  Keep all follow-up visits as told by your health care provider. This is important. Contact a health care provider if your symptoms:  Get worse.  Do not improve in 2 weeks. Get help right away if you have:  Severe pain and trouble with walking. Summary  A  bunion is a bump on the base of the big toe that forms when the bones of the big toe joint move out of position.  Bunions can make walking painful.  Treatment depends on the severity of your symptoms.  Support your toe joint with proper footwear, shoe padding, or taping as told by your health care provider. This information is not intended to replace advice given to you by your health care provider. Make sure you discuss any questions you have with your health care provider. Document Revised: 04/18/2018 Document Reviewed: 02/22/2018 Elsevier Patient Education  2020 [employer]    Plantar Fasciitis (Heel Spur Syndrome) with Rehab The plantar fascia is a fibrous, ligament-like, soft-tissue structure that spans the bottom of the foot. Plantar fasciitis is a condition that causes pain in the foot due to inflammation of the tissue. SYMPTOMS   Pain and tenderness on the underneath side of the foot.  Pain that worsens with standing or walking. CAUSES  Plantar fasciitis is caused by irritation and injury to the plantar fascia on the underneath side of the foot. Common mechanisms of injury include:  Direct trauma to bottom of the foot.  Damage to a small nerve that runs under the foot where the main fascia attaches to the heel bone.  Stress placed on the plantar fascia due to bone spurs. RISK INCREASES WITH:   Activities that place stress on the plantar fascia (running, jumping, pivoting, or cutting).  Poor strength and flexibility.  Improperly fitted shoes.  Tight calf muscles.  Flat feet.  Failure to warm-up properly before activity.  Obesity. PREVENTION  Warm up and stretch properly before activity.  Allow for adequate recovery between workouts.  Maintain physical fitness:  Strength, flexibility, and endurance.  Cardiovascular fitness.  Maintain a health body weight.  Avoid stress on the plantar fascia.  Wear properly fitted shoes, including arch supports for  individuals who have flat feet.  PROGNOSIS  If treated properly, then the symptoms of plantar fasciitis usually resolve without surgery. However, occasionally surgery is necessary.  RELATED COMPLICATIONS   Recurrent symptoms that may result in a chronic condition.  Problems of the lower back that are caused by compensating for the injury, such as limping.  Pain or weakness of the foot during push-off following surgery.  Chronic inflammation, scarring, and partial or complete fascia tear, occurring more often from repeated injections.  TREATMENT  Treatment initially involves the use of ice and medication to help reduce pain and inflammation. The use of strengthening and stretching exercises may help reduce pain with activity, especially stretches of the Achilles tendon. These exercises may be performed at home or with a therapist. Your caregiver may recommend that you use heel cups of arch supports to help reduce stress on the plantar fascia. Occasionally, corticosteroid injections are given to reduce inflammation. If symptoms persist for greater than 6 months   despite non-surgical (conservative), then surgery may be recommended.   MEDICATION   If pain medication is necessary, then nonsteroidal anti-inflammatory medications, such as aspirin and ibuprofen, or other minor pain relievers, such as acetaminophen, are often recommended.  Do not take pain medication within 7 days before surgery.  Prescription pain relievers may be given if deemed necessary by your caregiver. Use only as directed and only as much as you need.  Corticosteroid injections may be given by your caregiver. These injections should be reserved for the most serious cases, because they may only be given a certain number of times.  HEAT AND COLD  Cold treatment (icing) relieves pain and reduces inflammation. Cold treatment should be applied for 10 to 15 minutes every 2 to 3 hours for inflammation and pain and immediately  after any activity that aggravates your symptoms. Use ice packs or massage the area with a piece of ice (ice massage).  Heat treatment may be used prior to performing the stretching and strengthening activities prescribed by your caregiver, physical therapist, or athletic trainer. Use a heat pack or soak the injury in warm water.  SEEK IMMEDIATE MEDICAL CARE IF:  Treatment seems to offer no benefit, or the condition worsens.  Any medications produce adverse side effects.  EXERCISES- RANGE OF MOTION (ROM) AND STRETCHING EXERCISES - Plantar Fasciitis (Heel Spur Syndrome) These exercises may help you when beginning to rehabilitate your injury. Your symptoms may resolve with or without further involvement from your physician, physical therapist or athletic trainer. While completing these exercises, remember:   Restoring tissue flexibility helps normal motion to return to the joints. This allows healthier, less painful movement and activity.  An effective stretch should be held for at least 30 seconds.  A stretch should never be painful. You should only feel a gentle lengthening or release in the stretched tissue.  RANGE OF MOTION - Toe Extension, Flexion  Sit with your right / left leg crossed over your opposite knee.  Grasp your toes and gently pull them back toward the top of your foot. You should feel a stretch on the bottom of your toes and/or foot.  Hold this stretch for 10 seconds.  Now, gently pull your toes toward the bottom of your foot. You should feel a stretch on the top of your toes and or foot.  Hold this stretch for 10 seconds. Repeat  times. Complete this stretch 3 times per day.   RANGE OF MOTION - Ankle Dorsiflexion, Active Assisted  Remove shoes and sit on a chair that is preferably not on a carpeted surface.  Place right / left foot under knee. Extend your opposite leg for support.  Keeping your heel down, slide your right / left foot back toward the chair until  you feel a stretch at your ankle or calf. If you do not feel a stretch, slide your bottom forward to the edge of the chair, while still keeping your heel down.  Hold this stretch for 10 seconds. Repeat 3 times. Complete this stretch 2 times per day.   STRETCH  Gastroc, Standing  Place hands on wall.  Extend right / left leg, keeping the front knee somewhat bent.  Slightly point your toes inward on your back foot.  Keeping your right / left heel on the floor and your knee straight, shift your weight toward the wall, not allowing your back to arch.  You should feel a gentle stretch in the right / left calf. Hold this position for 10   seconds. Repeat 3 times. Complete this stretch 2 times per day.  STRETCH  Soleus, Standing  Place hands on wall.  Extend right / left leg, keeping the other knee somewhat bent.  Slightly point your toes inward on your back foot.  Keep your right / left heel on the floor, bend your back knee, and slightly shift your weight over the back leg so that you feel a gentle stretch deep in your back calf.  Hold this position for 10 seconds. Repeat 3 times. Complete this stretch 2 times per day.  STRETCH  Gastrocsoleus, Standing  Note: This exercise can place a lot of stress on your foot and ankle. Please complete this exercise only if specifically instructed by your caregiver.   Place the ball of your right / left foot on a step, keeping your other foot firmly on the same step.  Hold on to the wall or a rail for balance.  Slowly lift your other foot, allowing your body weight to press your heel down over the edge of the step.  You should feel a stretch in your right / left calf.  Hold this position for 10 seconds.  Repeat this exercise with a slight bend in your right / left knee. Repeat 3 times. Complete this stretch 2 times per day.   STRENGTHENING EXERCISES - Plantar Fasciitis (Heel Spur Syndrome)  These exercises may help you when beginning to  rehabilitate your injury. They may resolve your symptoms with or without further involvement from your physician, physical therapist or athletic trainer. While completing these exercises, remember:   Muscles can gain both the endurance and the strength needed for everyday activities through controlled exercises.  Complete these exercises as instructed by your physician, physical therapist or athletic trainer. Progress the resistance and repetitions only as guided.  STRENGTH - Towel Curls  Sit in a chair positioned on a non-carpeted surface.  Place your foot on a towel, keeping your heel on the floor.  Pull the towel toward your heel by only curling your toes. Keep your heel on the floor. Repeat 3 times. Complete this exercise 2 times per day.  STRENGTH - Ankle Inversion  Secure one end of a rubber exercise band/tubing to a fixed object (table, pole). Loop the other end around your foot just before your toes.  Place your fists between your knees. This will focus your strengthening at your ankle.  Slowly, pull your big toe up and in, making sure the band/tubing is positioned to resist the entire motion.  Hold this position for 10 seconds.  Have your muscles resist the band/tubing as it slowly pulls your foot back to the starting position. Repeat 3 times. Complete this exercises 2 times per day.  Document Released: 10/12/2005 Document Revised: 01/04/2012 Document Reviewed: 01/24/2009 [employer] Patient Information 2014 [employer]. 

## 2020-05-07 ENCOUNTER — Ambulatory Visit (INDEPENDENT_AMBULATORY_CARE_PROVIDER_SITE_OTHER): Payer: BC Managed Care – PPO | Admitting: Podiatrist

## 2020-05-07 ENCOUNTER — Encounter: Payer: Self-pay | Admitting: Podiatrist

## 2020-05-07 ENCOUNTER — Other Ambulatory Visit: Payer: Self-pay

## 2020-05-07 VITALS — Temp 97.8°F

## 2020-05-07 DIAGNOSIS — M722 Plantar fascial fibromatosis: Secondary | ICD-10-CM

## 2020-05-07 DIAGNOSIS — M7989 Other specified soft tissue disorders: Secondary | ICD-10-CM | POA: Diagnosis not present

## 2020-05-07 DIAGNOSIS — M799 Soft tissue disorder, unspecified: Secondary | ICD-10-CM

## 2020-05-07 MED ORDER — METHYLPREDNISOLONE 4 MG PO TBPK
ORAL_TABLET | ORAL | 1 refills | Status: DC
Start: 2020-05-07 — End: 2020-07-26

## 2020-05-07 NOTE — Progress Notes (Signed)
  Chief Complaint  Patient presents with  . Plantar Fasciitis    Follow-up; Right foot; pt stated, "The bottom of my heel still hurts; starting to get knots on both of my legs; I just noticed that yesterday; boot is helping with foot pain"; pt Diabetic type 2; Sugar=did not check today; A1C=5.7 (03/11/20)  . Ankle Pain    Follow-up; Right Ankle-lateral side; pt stated, "My ankle is hurting again"     HPI: Patient is 54 y.o. female who presents today for the concerns as listed above.  She states the boot, the meloxicam and staying off her foot from work have helped but she is still having heel pain and pain along the lateral foot/ankle.  She also has concerns over "bumps" on her legs which she relates are new for her- they are present on both legs and she denies any trauma or injury.      Physical Exam  GENERAL APPEARANCE: Alert, conversant. Appropriately groomed. No acute distress.   VASCULAR: Pedal pulses palpable DP and PT bilateral.  Capillary refill time is immediate to all digits,  Proximal to distal cooling it warm to warm.  Digital perfusion adequate.   NEUROLOGIC: sensation is intact epicritically and protectively to 5.07 monofilament at 5/5 sites bilateral.  Light touch is intact bilateral, vibratory sensation intact bilateral, achilles tendon reflex is intact bilateral. Negative tinnels sign elicited right foot.    MUSCULOSKELETAL: acceptable muscle strength, tone and stability bilateral.  Pain on palpation plantar medial and plantar lateral foot- no lateral ankle pain appreciated.  Ankle stable with inversion eversion, dorsiflexion and plantarflexion.  Anterior drawer negative.  Calf is supple and soft right foot.   DERMATOLOGIC: skin is warm, supple, and dry.  Small 1-2 cm slightly raised lesions present on the anterior leg bilateral-  Numbering 4 to 6 on each leg.  They are soft and compressible and resemble either a lipoma or superficial variscosity.    Assessment   Plantar  fasciitis right foot Soft tissue lesion right and left leg  Plan  Discussed treatment options and at this time a plantar fascial injection was recommended.  The patient agreed and a sterile skin prep was applied.  An injection consisting of 10mg  kenalog and marcaine mixture was infiltrated at the point of maximal tenderness on the right Heel.  The patient tolerated this well.  A rx for a medrol dose pack was written for her to take if she has no relief from the injection in 2-4 days.   She will stop meloxicam while taking.  We discussed sending her for a diagnostic ultrasound regarding the lumps-  She will think about it an ddiscuss with Dr. at her next visit.            Shot-  Diagnostic ultrasound for leg knots

## 2020-05-10 DIAGNOSIS — M79676 Pain in unspecified toe(s): Secondary | ICD-10-CM

## 2020-05-14 ENCOUNTER — Encounter: Payer: Self-pay | Admitting: Sports Medicine

## 2020-05-14 ENCOUNTER — Ambulatory Visit (INDEPENDENT_AMBULATORY_CARE_PROVIDER_SITE_OTHER): Payer: BC Managed Care – PPO | Admitting: Sports Medicine

## 2020-05-14 ENCOUNTER — Other Ambulatory Visit: Payer: Self-pay

## 2020-05-14 DIAGNOSIS — E119 Type 2 diabetes mellitus without complications: Secondary | ICD-10-CM

## 2020-05-14 DIAGNOSIS — M21619 Bunion of unspecified foot: Secondary | ICD-10-CM

## 2020-05-14 DIAGNOSIS — M722 Plantar fascial fibromatosis: Secondary | ICD-10-CM

## 2020-05-14 DIAGNOSIS — M7989 Other specified soft tissue disorders: Secondary | ICD-10-CM | POA: Diagnosis not present

## 2020-05-14 DIAGNOSIS — L84 Corns and callosities: Secondary | ICD-10-CM

## 2020-05-14 DIAGNOSIS — M779 Enthesopathy, unspecified: Secondary | ICD-10-CM

## 2020-05-14 DIAGNOSIS — M2040 Other hammer toe(s) (acquired), unspecified foot: Secondary | ICD-10-CM

## 2020-05-14 DIAGNOSIS — M79671 Pain in right foot: Secondary | ICD-10-CM | POA: Diagnosis not present

## 2020-05-14 DIAGNOSIS — M799 Soft tissue disorder, unspecified: Secondary | ICD-10-CM

## 2020-05-14 NOTE — Progress Notes (Signed)
Subjective: Alexandra Randall is a 54 y.o. female patient who returns office for follow-up evaluation of right foot pain and swelling to legs with palpable knots along the legs.  Patient reports that she has been using cam boot but still has pain at the lateral side of the ankle and at the medial side of the ankle at the area of the heel reports that she is not taking the Medrol Dosepak yet and has stopped taking meloxicam after she got an injection on last visit reports that the injection helped dull down the pain a little bit but pain is still present most of the time 8 out of 10 reports that she wants to discuss further about what to do about the soft tissue bumps on her leg reports that sometimes they get sore especially the 1 at the distal right medial tibia.  Patient reports that she is currently out of work on [employer] as previously written for the patient because she has to wear the boot and her job will not allow her to work with the cam boot.  Fasting blood sugar this morning not recorded by last A1c 5.6    Patient Active Problem List   Diagnosis Date Noted  . Acute pansinusitis 04/23/2020  . Diabetes mellitus ([place]) 04/23/2020  . Dysuria 04/23/2020  . Fatigue 04/23/2020  . Nausea and vomiting 04/23/2020  . Sinusitis 04/23/2020  . Urine finding 04/23/2020  . Vitamin D deficiency 04/23/2020  . Encounter to establish care with new doctor 03/12/2020  . Type 2 diabetes mellitus without complications ([place]) 10/62/6948  . Foot callus 03/12/2020  . Thoracic back pain 03/12/2020  . Polyuria 03/12/2020  . Hypertension associated with diabetes ([place]) 03/12/2020  . Pain in right knee 01/19/2020  . Left wrist pain 08/18/2019  . Neck pain 08/18/2019  . Swelling of extremity, left 08/18/2019  . Gastroesophageal reflux disease 06/16/2019  . Dental infection 02/03/2018  . Left ear pain 02/03/2018  . Smokes 02/03/2018  . Disorder of intervertebral disc of lumbar spine 01/11/2017  . Allergic rhinitis  08/13/2016  . Hyperlipidemia   . Graves disease     Current Outpatient Medications on File Prior to Visit  Medication Sig Dispense Refill  . atorvastatin (LIPITOR) 40 MG tablet Take 40 mg by mouth daily.     . Blood Glucose Monitoring Suppl (ONE TOUCH ULTRA 2) w/Device KIT USE AS DIRECTED TO CHECK BLOOD SUGAR DAILY  0  . Cholecalciferol (VITAMIN D) 2000 units CAPS Take by mouth.    Marland Kitchen lisinopril-hydrochlorothiazide (PRINZIDE,ZESTORETIC) 20-12.5 MG tablet Take 1 tablet by mouth daily.  1  . loratadine (CLARITIN) 10 MG tablet Take 10 mg by mouth daily.    . methimazole (TAPAZOLE) 5 MG tablet Take 5 mg by mouth daily.    . ONE TOUCH ULTRA TEST test strip USE AS DIRECTED TO CHECK BLOOD SUGAR DAILY  6  . Probiotic Product (PROBIOTIC PO) Take by mouth daily.    . meloxicam (MOBIC) 15 MG tablet Take 1 tablet (15 mg total) by mouth daily. (Patient not taking: Reported on 05/14/2020) 30 tablet 0  . methylPREDNISolone (MEDROL DOSEPAK) 4 MG TBPK tablet 6 day taper dose pack.  Follow instructions on package-  Stop taking meloxicam while on this medication (Patient not taking: Reported on 05/14/2020) 21 tablet 1   No current facility-administered medications on file prior to visit.    Allergies  Allergen Reactions  . Hydrocodone Other (See Comments)    Heart palpitations     Objective:  General: Alert and oriented x3 in no acute distress  Dermatology: Minimal keratotic lesion submet 4 on right foot with no acute signs of infection, no ecchymosis bilateral, all nails x 10 are well manicured.  Raised soft tissue lesions noted to the anterior shins bilateral that measure less than 1 cm along the anterior shin raised soft with no redness warmth or pulsation to these lesions likely consistent with dilated blood vessels versus lipoma.  Vascular: Dorsalis Pedis and Posterior Tibial pedal pulses palpable, Capillary Fill Time 3 seconds,(+) pedal hair growth bilateral, minimal edema bilateral lower  extremities, Temperature gradient within normal limits.  Neurology: Johney Maine sensation intact via light touch bilateral.  Musculoskeletal: Moderate tenderness to palpation along the plantar fascial insertion greater than the Achilles insertion on the right heel, there is often pain today along the lateral heel along the peroneal tendon course, Thompson sign negative on right, there is also pain to palpation to medial eminence at bunion on right with guarding and limitation with range of motion at right first metatarsophalangeal joint, as well as early second hammertoe ankle range of motion is limited with increased pain upon dorsiflexion and plantarflexion with inversion of the right foot.  Pes planus foot type with above-mentioned bunion and hammertoe deformity.  Assessment and Plan: Problem List Items Addressed This Visit      Endocrine   Diabetes mellitus ([place])    Other Visit Diagnoses    Lesion of soft tissue of lower leg and ankle    -  Primary   Plantar fasciitis       Tendinitis       Right foot pain       Bunion       Hammer toe, unspecified laterality       Callus of foot           -Complete examination performed -Discussed with patient tendinitis and fasciitis ongoing not relieved with the injection concerning for possible acute on chronic tearing since symptoms have not dramatically improved after steroid injection and soft tissue nodules to anterior shins -Ordered ultrasound for further evaluation of soft tissue nodules at anterior shin bilateral lower extremities -Advised patient to start taking her Medrol Dosepak as prescribed by Dr. Rolley Sims last visit after she has completed this medication if she is still in severe pain at right ankle and heel we will proceed with ordering MRI, advised patient to call office if she is still hurting for me to proceed with ordering MRI to evaluate for possible tear  -Again advised patient that her bunion and hammertoes are separate issue that  may require surgery however at this time due to her other complaints that are more painful recommend on getting those better before considering any type of surgery for her bunions or hammertoes like previous -Continue with cam boot -Advised rest ice elevation daily until symptoms improve -Continue with daily skin emollients for callus skin at the plantar surface of the right foot minimal at today's visit -Continue with FMLA until next office visit -Patient to return to office after ultrasound or sooner if condition worsens.  Landis Martins, DPM

## 2020-05-16 ENCOUNTER — Telehealth: Payer: Self-pay | Admitting: *Deleted

## 2020-05-16 DIAGNOSIS — M799 Soft tissue disorder, unspecified: Secondary | ICD-10-CM

## 2020-05-16 DIAGNOSIS — M7989 Other specified soft tissue disorders: Secondary | ICD-10-CM

## 2020-05-16 NOTE — Telephone Encounter (Signed)
Faxed orders to [facility]. 

## 2020-05-16 NOTE — Telephone Encounter (Signed)
-----   Message from Asencion Islam, [place] sent at 05/14/2020  8:34 PM EDT ----- Regarding: [facility] ultrasound bilateral lower extremities Soft tissue masses at the anterior shin measures less than 1 cm please evaluate the extent of these masses there is mild pain noted to the the right inferior distal tibia mass

## 2020-05-20 ENCOUNTER — Telehealth: Payer: Self-pay | Admitting: *Deleted

## 2020-05-20 DIAGNOSIS — M799 Soft tissue disorder, unspecified: Secondary | ICD-10-CM

## 2020-05-20 DIAGNOSIS — M7989 Other specified soft tissue disorders: Secondary | ICD-10-CM

## 2020-05-20 DIAGNOSIS — M722 Plantar fascial fibromatosis: Secondary | ICD-10-CM

## 2020-05-20 DIAGNOSIS — M779 Enthesopathy, unspecified: Secondary | ICD-10-CM

## 2020-05-20 NOTE — Telephone Encounter (Signed)
Pt states Dr. Marylene Land had wanted her to call with the status of her foot, swelling had gone down but there is still pain at the bottom.

## 2020-05-20 NOTE — Telephone Encounter (Signed)
Great. Val I told the patient that if she is still in pain at her ankle and heel that we would proceed with ordering MRI. You may proceed with order for MRI Right ankle to eval peroneal tendon and plantar fascia r/o acute on chronic tear

## 2020-05-21 NOTE — Telephone Encounter (Signed)
Faxed orders, clinicals to [facility] for pre-cert and scheduling. 

## 2020-05-27 ENCOUNTER — Ambulatory Visit
Admission: RE | Admit: 2020-05-27 | Discharge: 2020-05-27 | Disposition: A | Discharge status: Home / Self Care | Payer: BC Managed Care – PPO | Source: Ambulatory Visit | Attending: Sports Medicine | Admitting: Sports Medicine

## 2020-06-16 ENCOUNTER — Other Ambulatory Visit: Payer: Self-pay

## 2020-06-16 ENCOUNTER — Ambulatory Visit
Admission: RE | Admit: 2020-06-16 | Discharge: 2020-06-16 | Disposition: A | Discharge status: Home / Self Care | Payer: BC Managed Care – PPO | Source: Ambulatory Visit | Attending: Sports Medicine | Admitting: Sports Medicine

## 2020-06-16 DIAGNOSIS — M25571 Pain in right ankle and joints of right foot: Secondary | ICD-10-CM | POA: Diagnosis not present

## 2020-06-20 ENCOUNTER — Telehealth: Payer: Self-pay | Admitting: Sports Medicine

## 2020-06-20 NOTE — Telephone Encounter (Signed)
Alexandra Randall, Will you let patient know that her MRI is negative. There are no abnormal findings. She should continue with FMLA and return on 9/3 if she is feeling better. If she is NOT feeling better we can continue out of work/FMLA until her next office appointment, have her to speak with Marylene Land in the [place] office who can help with completing/ updating any FMLA paper work Thanks Dr. Marylene Land

## 2020-06-20 NOTE — Telephone Encounter (Signed)
Pt called and wanted to know about mri and what to do about fmla and returning to work on sept 3 please aqdvise

## 2020-06-21 ENCOUNTER — Telehealth: Payer: Self-pay | Admitting: Sports Medicine

## 2020-06-21 NOTE — Telephone Encounter (Signed)
Called Pt back with MRI results and Dr's advice, but call went straight to voicmail

## 2020-06-21 NOTE — Telephone Encounter (Signed)
I spoke with patient about, return to work date. Mrs. Alexandra Randall said, you guys discussed her continuing out of work to have surgery on her two toes. She would like someone to give her a call to discuss. Then please advise on Return to work date.

## 2020-06-21 NOTE — Telephone Encounter (Signed)
I called her and left a voicemail explaining that We will keep her out of work until her next office on 10/7 and at that visit we can discuss her other concerns and pain and a plan for her symptoms and when she will return to work -Dr. Kathie Rhodes

## 2020-07-26 ENCOUNTER — Encounter: Payer: Self-pay | Admitting: Family Medicine

## 2020-07-26 ENCOUNTER — Other Ambulatory Visit: Payer: Self-pay

## 2020-07-26 ENCOUNTER — Ambulatory Visit (INDEPENDENT_AMBULATORY_CARE_PROVIDER_SITE_OTHER): Payer: BC Managed Care – PPO | Admitting: Family Medicine

## 2020-07-26 DIAGNOSIS — H6690 Otitis media, unspecified, unspecified ear: Secondary | ICD-10-CM | POA: Insufficient documentation

## 2020-07-26 DIAGNOSIS — J302 Other seasonal allergic rhinitis: Secondary | ICD-10-CM

## 2020-07-26 DIAGNOSIS — H66002 Acute suppurative otitis media without spontaneous rupture of ear drum, left ear: Secondary | ICD-10-CM | POA: Diagnosis not present

## 2020-07-26 MED ORDER — LORATADINE 10 MG PO TABS: 10.0000 mg | ORAL_TABLET | Freq: Every day | ORAL | 2 refills | Status: AC

## 2020-07-26 MED ORDER — AMOXICILLIN 875 MG PO TABS: 875.0000 mg | ORAL_TABLET | Freq: Two times a day (BID) | ORAL | 0 refills | Status: DC

## 2020-07-26 NOTE — Patient Instructions (Addendum)
It was a pleasure to see you today!  Thank you for choosing [facility] for your primary care.  Alexandra Randall was seen for left ear pain.   Our plans for today were:  I have prescribed a medication called amoxicillin.  Please take this every 12 hours for 10 days.  You can also continue to use the eardrops to help with pain.  I also recommend taking ibuprofen to help with pain as well.  Please return to our office if you begin to develop facial pain or new onset dizziness or swelling behind your ears.  To keep you healthy, please keep in mind the following health maintenance items that you are due for:   1. Pap Smear  2. HIV Screening  3. Mammogram   Best Wishes,   Dr. Neita Garnet    Otitis Media, Adult  Otitis media occurs when there is inflammation and fluid in the middle ear. Your middle ear is a part of the ear that contains bones for hearing as well as air that helps send sounds to your brain. What are the causes? This condition is caused by a blockage in the eustachian tube. This tube drains fluid from the ear to the back of the nose (nasopharynx). A blockage in this tube can be caused by an object or by swelling (edema) in the tube. Problems that can cause a blockage include:  A cold or other upper respiratory infection.  Allergies.  An irritant, such as tobacco smoke.  Enlarged adenoids. The adenoids are areas of soft tissue located high in the back of the throat, behind the nose and the roof of the mouth.  A mass in the nasopharynx.  Damage to the ear caused by pressure changes (barotrauma). What are the signs or symptoms? Symptoms of this condition include:  Ear pain.  A fever.  Decreased hearing.  A headache.  Tiredness (lethargy).  Fluid leaking from the ear.  Ringing in the ear. How is this diagnosed? This condition is diagnosed with a physical exam. During the exam your health care provider will use an instrument called an  otoscope to look into your ear and check for redness, swelling, and fluid. He or she will also ask about your symptoms. Your health care provider may also order tests, such as:  A test to check the movement of the eardrum (pneumatic otoscopy). This test is done by squeezing a small amount of air into the ear.  A test that changes air pressure in the middle ear to check how well the eardrum moves and whether the eustachian tube is working (tympanogram). How is this treated? This condition usually goes away on its own within 3-5 days. But if the condition is caused by a bacteria infection and does not go away own its own, or keeps coming back, your health care provider may:  Prescribe antibiotic medicines to treat the infection.  Prescribe or recommend medicines to control pain. Follow these instructions at home:  Take over-the-counter and prescription medicines only as told by your health care provider.  If you were prescribed an antibiotic medicine, take it as told by your health care provider. Do not stop taking the antibiotic even if you start to feel better.  Keep all follow-up visits as told by your health care provider. This is important. Contact a health care provider if:  You have bleeding from your nose.  There is a lump on your neck.  You are not getting better in 5 days.  You feel worse instead of better. Get help right away if:  You have severe pain that is not controlled with medicine.  You have swelling, redness, or pain around your ear.  You have stiffness in your neck.  A part of your face is paralyzed.  The bone behind your ear (mastoid) is tender when you touch it.  You develop a severe headache. Summary  Otitis media is redness, soreness, and swelling of the middle ear.  This condition usually goes away on its own within 3-5 days.  If the problem does not go away in 3-5 days, your health care provider may prescribe or recommend medicines to treat your  symptoms.  If you were prescribed an antibiotic medicine, take it as told by your health care provider. This information is not intended to replace advice given to you by your health care provider. Make sure you discuss any questions you have with your health care provider. Document Revised: 09/24/2017 Document Reviewed: 10/02/2016 Elsevier Patient Education  2020 [employer].

## 2020-07-26 NOTE — Assessment & Plan Note (Signed)
TM bulging and tenderness during otoscopic exam  - prescribed AMox 875 BID for 10 days

## 2020-07-26 NOTE — Progress Notes (Signed)
    SUBJECTIVE:   CHIEF COMPLAINT / HPI: left ear pain   Patient reports pain in her left ear for the last four days. She reports having trouble with her sinuses for years and notices flare up during change of season. She reports sharp pain in left ear and then the left side of neck began to feel stiff. She tried to use ear relief drops that did not relieve her pain. She started taking theraflu that helped with sinus drainage. The HA has stopped in the interim. She reports sleeping with a fan on and thinks this may have contributed. Denies sore throat, denies a fever but reports taking tylenol that may have impacted a fever if she did have one. Appetite is normal per patient.   PERTINENT  PMH / PSH:  Pansinusitis, historical   OBJECTIVE:   BP 120/80   Pulse 78   Ht 5' 4.5" (1.638 m)   Wt 173 lb (78.5 kg)   LMP 10/16/2016 (Approximate)   SpO2 100%   BMI 29.24 kg/m    General: female appearing stated age in no acute distress HEENT: MMM, no oral lesions noted,Neck non-tender without lymphadenopathy, left TM bulging without erythema, ear canal tender to ear speculum during otoscopic exam Cardio: Normal S1 and S2, no S3 or S4. Rhythm is regular. No murmurs or rubs.  Bilateral radial pulses palpable Pulm: Clear to auscultation bilaterally, no crackles, wheezing, or diminished breath sounds. Normal respiratory effort, stable on RA   ASSESSMENT/PLAN:   Otitis media TM bulging and tenderness during otoscopic exam  - prescribed AMox 875 BID for 10 days   Allergic rhinitis Claritin refilled per patient request     Ronnald Ramp, MD [facility]

## 2020-07-29 NOTE — Assessment & Plan Note (Signed)
Claritin refilled per patient request

## 2020-08-01 ENCOUNTER — Ambulatory Visit (INDEPENDENT_AMBULATORY_CARE_PROVIDER_SITE_OTHER): Payer: BC Managed Care – PPO | Admitting: Sports Medicine

## 2020-08-01 ENCOUNTER — Encounter: Payer: Self-pay | Admitting: Sports Medicine

## 2020-08-01 ENCOUNTER — Other Ambulatory Visit: Payer: Self-pay

## 2020-08-01 DIAGNOSIS — M2040 Other hammer toe(s) (acquired), unspecified foot: Secondary | ICD-10-CM

## 2020-08-01 DIAGNOSIS — M21619 Bunion of unspecified foot: Secondary | ICD-10-CM

## 2020-08-01 DIAGNOSIS — M7989 Other specified soft tissue disorders: Secondary | ICD-10-CM

## 2020-08-01 DIAGNOSIS — M79671 Pain in right foot: Secondary | ICD-10-CM

## 2020-08-01 DIAGNOSIS — M722 Plantar fascial fibromatosis: Secondary | ICD-10-CM | POA: Diagnosis not present

## 2020-08-01 DIAGNOSIS — M779 Enthesopathy, unspecified: Secondary | ICD-10-CM

## 2020-08-01 DIAGNOSIS — M799 Soft tissue disorder, unspecified: Secondary | ICD-10-CM

## 2020-08-01 MED ORDER — KETOCONAZOLE 2 % EX CREA: 1.0000 "application " | TOPICAL_CREAM | Freq: Every day | CUTANEOUS | 5 refills | Status: AC

## 2020-08-01 NOTE — Progress Notes (Addendum)
Subjective: Alexandra Randall is a 54 y.o. female patient who returns office for follow-up evaluation of right foot pain and reports that pain is a little better, now 5/10 with CAM boot. Fasting blood sugar this morning not recorded by last A1c 5.6    Patient Active Problem List   Diagnosis Date Noted  . Otitis media 07/26/2020  . Acute pansinusitis 04/23/2020  . Diabetes mellitus ([place]) 04/23/2020  . Dysuria 04/23/2020  . Fatigue 04/23/2020  . Nausea and vomiting 04/23/2020  . Sinusitis 04/23/2020  . Urine finding 04/23/2020  . Vitamin D deficiency 04/23/2020  . Encounter to establish care with new doctor 03/12/2020  . Type 2 diabetes mellitus without complications ([place]) 32/67/1245  . Foot callus 03/12/2020  . Thoracic back pain 03/12/2020  . Polyuria 03/12/2020  . Hypertension associated with diabetes ([place]) 03/12/2020  . Pain in right knee 01/19/2020  . Left wrist pain 08/18/2019  . Neck pain 08/18/2019  . Swelling of extremity, left 08/18/2019  . Gastroesophageal reflux disease 06/16/2019  . Dental infection 02/03/2018  . Left ear pain 02/03/2018  . Smokes 02/03/2018  . Disorder of intervertebral disc of lumbar spine 01/11/2017  . Allergic rhinitis 08/13/2016  . Hyperlipidemia   . Graves disease     Current Outpatient Medications on File Prior to Visit  Medication Sig Dispense Refill  . amoxicillin (AMOXIL) 875 MG tablet Take 1 tablet (875 mg total) by mouth 2 (two) times daily. 20 tablet 0  . atorvastatin (LIPITOR) 40 MG tablet Take 40 mg by mouth daily.     . Blood Glucose Monitoring Suppl (ONE TOUCH ULTRA 2) w/Device KIT USE AS DIRECTED TO CHECK BLOOD SUGAR DAILY  0  . Cholecalciferol (VITAMIN D) 2000 units CAPS Take by mouth.    Marland Kitchen lisinopril-hydrochlorothiazide (PRINZIDE,ZESTORETIC) 20-12.5 MG tablet Take 1 tablet by mouth daily.  1  . loratadine (CLARITIN) 10 MG tablet Take 1 tablet (10 mg total) by mouth daily. 60 tablet 2  . methimazole (TAPAZOLE) 5 MG tablet  Take 5 mg by mouth daily.    . ONE TOUCH ULTRA TEST test strip USE AS DIRECTED TO CHECK BLOOD SUGAR DAILY  6  . Probiotic Product (PROBIOTIC PO) Take by mouth daily.     No current facility-administered medications on file prior to visit.    Allergies  Allergen Reactions  . Hydrocodone Other (See Comments)    Heart palpitations     Objective:  General: Alert and oriented x3 in no acute distress  Dermatology: Minimal keratotic lesion submet 4 on right foot with no acute signs of infection, no ecchymosis bilateral, all nails x 10 are well manicured.  Raised soft tissue lesions noted to the anterior shins bilateral that measure less than 1 cm along the anterior shin raised soft with no redness warmth or pulsation to these lesions likely consistent with dilated blood vessels versus lipoma.  Vascular: Dorsalis Pedis and Posterior Tibial pedal pulses palpable, Capillary Fill Time 3 seconds,(+) pedal hair growth bilateral, minimal edema bilateral lower extremities, Temperature gradient within normal limits.  Neurology: Johney Maine sensation intact via light touch bilateral.  Musculoskeletal:Mild tenderness to palpation along the plantar fascial insertion greater than the Achilles insertion on the right heel, there is decreased pain today along the lateral heel along the peroneal tendon course, Thompson sign negative on right, there is also pain to palpation to medial eminence at bunion on right with guarding and limitation with range of motion at right first metatarsophalangeal joint, as well as early second  hammertoe ankle range of motion is limited with increased pain upon dorsiflexion and plantarflexion with inversion of the right foot.  Pes planus foot type with above-mentioned bunion and hammertoe deformity.  Assessment and Plan: Problem List Items Addressed This Visit    None    Visit Diagnoses    Bunion    -  Primary   Tendinitis       Plantar fasciitis       Lesion of soft tissue of lower  leg and ankle       Right foot pain       Hammer toe, unspecified laterality           -Complete examination performed -Re-discussed right foot pain and MRI results that are negative -Recommend continue with cam boot for 2 more weeks -Rx Prednisone -Advised patient next visit we will further discuss surgery for bunion and 2nd hammertoe -Patient to return to office as scheduled for surgery consult or sooner if condition worsens.  Landis Martins, DPM

## 2020-08-12 ENCOUNTER — Telehealth: Payer: Self-pay | Admitting: Sports Medicine

## 2020-08-12 NOTE — Telephone Encounter (Signed)
How long would you like for me to extend, RTW. Alexandra Randall, said she is coming in to see you about surgery and would like her disability extended. She is scheduled to come in to see you on 08/22/2020. Please advise?

## 2020-08-12 NOTE — Telephone Encounter (Signed)
Extend until 08/26/20. When she comes in on 10/28 we will discuss surgery and the additional time she will need out of work to include for post op recovery Thanks Dr. Kathie Rhodes

## 2020-08-20 DIAGNOSIS — Z6828 Body mass index (BMI) 28.0-28.9, adult: Secondary | ICD-10-CM | POA: Diagnosis not present

## 2020-08-20 DIAGNOSIS — Z01419 Encounter for gynecological examination (general) (routine) without abnormal findings: Secondary | ICD-10-CM | POA: Diagnosis not present

## 2020-08-20 DIAGNOSIS — Z1231 Encounter for screening mammogram for malignant neoplasm of breast: Secondary | ICD-10-CM | POA: Diagnosis not present

## 2020-08-22 ENCOUNTER — Other Ambulatory Visit: Payer: Self-pay

## 2020-08-22 ENCOUNTER — Encounter: Payer: Self-pay | Admitting: Sports Medicine

## 2020-08-22 ENCOUNTER — Ambulatory Visit (INDEPENDENT_AMBULATORY_CARE_PROVIDER_SITE_OTHER): Payer: BC Managed Care – PPO | Admitting: Sports Medicine

## 2020-08-22 DIAGNOSIS — M722 Plantar fascial fibromatosis: Secondary | ICD-10-CM

## 2020-08-22 DIAGNOSIS — M21619 Bunion of unspecified foot: Secondary | ICD-10-CM | POA: Diagnosis not present

## 2020-08-22 DIAGNOSIS — E119 Type 2 diabetes mellitus without complications: Secondary | ICD-10-CM

## 2020-08-22 DIAGNOSIS — M2040 Other hammer toe(s) (acquired), unspecified foot: Secondary | ICD-10-CM

## 2020-08-22 DIAGNOSIS — M79671 Pain in right foot: Secondary | ICD-10-CM

## 2020-08-22 NOTE — Progress Notes (Signed)
Subjective: Alexandra Randall is a 54 y.o. female patient who returns office for follow-up evaluation of right foot pain and surgery consult however is concerned and wants to get her bunion and hammertoe fixed.  Patient denies any changes in medication or any new problems since last encounter.  Fasting blood sugar this morning not recorded by last A1c 5.6    Patient Active Problem List   Diagnosis Date Noted  . Otitis media 07/26/2020  . Acute pansinusitis 04/23/2020  . Diabetes mellitus ([place]) 04/23/2020  . Dysuria 04/23/2020  . Fatigue 04/23/2020  . Nausea and vomiting 04/23/2020  . Sinusitis 04/23/2020  . Urine finding 04/23/2020  . Vitamin D deficiency 04/23/2020  . Encounter to establish care with new doctor 03/12/2020  . Type 2 diabetes mellitus without complications ([place]) 63/89/3734  . Foot callus 03/12/2020  . Thoracic back pain 03/12/2020  . Polyuria 03/12/2020  . Hypertension associated with diabetes ([place]) 03/12/2020  . Pain in right knee 01/19/2020  . Left wrist pain 08/18/2019  . Neck pain 08/18/2019  . Swelling of extremity, left 08/18/2019  . Gastroesophageal reflux disease 06/16/2019  . Dental infection 02/03/2018  . Left ear pain 02/03/2018  . Smokes 02/03/2018  . Disorder of intervertebral disc of lumbar spine 01/11/2017  . Allergic rhinitis 08/13/2016  . Hyperlipidemia   . Graves disease     Current Outpatient Medications on File Prior to Visit  Medication Sig Dispense Refill  . amoxicillin (AMOXIL) 875 MG tablet Take 1 tablet (875 mg total) by mouth 2 (two) times daily. 20 tablet 0  . atorvastatin (LIPITOR) 40 MG tablet Take 40 mg by mouth daily.     . Blood Glucose Monitoring Suppl (ONE TOUCH ULTRA 2) w/Device KIT USE AS DIRECTED TO CHECK BLOOD SUGAR DAILY  0  . Cholecalciferol (VITAMIN D) 2000 units CAPS Take by mouth.    Marland Kitchen ketoconazole (NIZORAL) 2 % cream Apply 1 application topically daily. 30 g 5  . lisinopril-hydrochlorothiazide  (PRINZIDE,ZESTORETIC) 20-12.5 MG tablet Take 1 tablet by mouth daily.  1  . loratadine (CLARITIN) 10 MG tablet Take 1 tablet (10 mg total) by mouth daily. 60 tablet 2  . methimazole (TAPAZOLE) 5 MG tablet Take 5 mg by mouth daily.    . ONE TOUCH ULTRA TEST test strip USE AS DIRECTED TO CHECK BLOOD SUGAR DAILY  6  . Probiotic Product (PROBIOTIC PO) Take by mouth daily.     No current facility-administered medications on file prior to visit.    Allergies  Allergen Reactions  . Hydrocodone Other (See Comments)    Heart palpitations    Past Surgical History:  Procedure Laterality Date  . CHOLECYSTECTOMY    . MASS EXCISION Right 04/03/2014   Procedure: EXCISION MASS RIGHT LOWER BACK;  Surgeon: Harl Bowie, MD;  Location: [facility];  Service: General;  Laterality: Right;  . OVARIAN CYST REMOVAL      Social History   Socioeconomic History  . Marital status: Divorced    Spouse name: Not on file  . Number of children: 3  . Years of education: Not on file  . Highest education level: Not on file  Occupational History    Employer: [employer]  Tobacco Use  . Smoking status: Current Every Day Smoker    Packs/day: 0.50    Years: 20.00    Pack years: 10.00    Types: Cigarettes  . Smokeless tobacco: Never Used  Vaping Use  . Vaping Use: Never used  Substance and Sexual Activity  .  Alcohol use: Yes    Comment: Occasion  . Drug use: No  . Sexual activity: Yes    Birth control/protection: Condom  Other Topics Concern  . Not on file  Social History Narrative  . Not on file   Social Determinants of Health   Financial Resource Strain:   . Difficulty of Paying Living Expenses: Not on file  Food Insecurity:   . Worried About Charity fundraiser in the Last Year: Not on file  . Ran Out of Food in the Last Year: Not on file  Transportation Needs:   . Lack of Transportation (Medical): Not on file  . Lack of Transportation (Non-Medical): Not on file  Physical Activity:   . Days  of Exercise per Week: Not on file  . Minutes of Exercise per Session: Not on file  Stress:   . Feeling of Stress : Not on file  Social Connections:   . Frequency of Communication with Friends and Family: Not on file  . Frequency of Social Gatherings with Friends and Family: Not on file  . Attends Religious Services: Not on file  . Active Member of Clubs or Organizations: Not on file  . Attends Archivist Meetings: Not on file  . Marital Status: Not on file    Family History  Problem Relation Age of Onset  . Cancer Mother 71       throat  . Diabetes Mother   . Cancer Father 51       lung  . Hypertension Father   . Prostate cancer Father   . Colon cancer Neg Hx   . Stomach cancer Neg Hx   . Liver cancer Neg Hx   . Esophageal cancer Neg Hx   . Rectal cancer Neg Hx      Objective:  General: Alert and oriented x3 in no acute distress  Dermatology: Minimal keratotic lesion submet 4 on right foot with no acute signs of infection, no ecchymosis bilateral, all nails x 10 are well manicured.  Raised soft tissue lesions noted to the anterior shins bilateral that measure less than 1 cm along the anterior shin raised soft with no redness warmth or pulsation to these lesions likely consistent with dilated blood vessels versus lipoma unchanged from prior currently asymptomatic.  Vascular: Dorsalis Pedis and Posterior Tibial pedal pulses palpable, Capillary Fill Time 3 seconds,(+) pedal hair growth bilateral, minimal edema bilateral lower extremities, Temperature gradient within normal limits.  Neurology: Johney Maine sensation intact via light touch bilateral.  Musculoskeletal:Mild tenderness to palpation along the plantar fascial insertion greater than the Achilles insertion on the right heel, there is pain along bunion and hammertoe on right  with guarding and limitation with range of motion at right first metatarsophalangeal joint, as well as early second hammertoe ankle range of motion  is limited with increased pain upon dorsiflexion and plantarflexion with inversion of the right foot.  Pes planus foot type with above-mentioned bunion and hammertoe deformity.  Assessment and Plan: Problem List Items Addressed This Visit      Endocrine   Diabetes mellitus ([place])    Other Visit Diagnoses    Bunion    -  Primary   Hammer toe, unspecified laterality       Plantar fasciitis       Right foot pain           -Complete examination performed -Re-discussed right foot pain  --Patient opt for surgical management. Consent obtained for right Noreene Larsson bunionectomy and  2nd hammertoe with metatarsal osteotomy as well as plantar fascial release of right heel. Pre and Post op course explained. Risks, benefits, alternatives explained. No guarantees given or implied. Surgical booking slip submitted and provided patient with Surgical packet and info for [facility] -To dispense crutches and weight shoe at surgical center patient to be strictly nonweightbearing for a minimum of 2 weeks and then will be allowed to heel weight-bear -Meanwhile may continue with cam boot until time for surgery -Patient to return to office after surgery or sooner if condition worsens.  Landis Martins, DPM

## 2020-08-27 ENCOUNTER — Encounter: Payer: Self-pay | Admitting: Sports Medicine

## 2020-09-05 ENCOUNTER — Ambulatory Visit (INDEPENDENT_AMBULATORY_CARE_PROVIDER_SITE_OTHER): Payer: BC Managed Care – PPO | Admitting: Family Medicine

## 2020-09-05 ENCOUNTER — Other Ambulatory Visit: Payer: Self-pay

## 2020-09-05 VITALS — BP 130/68 | Ht 64.0 in | Wt 173.0 lb

## 2020-09-05 DIAGNOSIS — M5412 Radiculopathy, cervical region: Secondary | ICD-10-CM | POA: Diagnosis not present

## 2020-09-05 DIAGNOSIS — E119 Type 2 diabetes mellitus without complications: Secondary | ICD-10-CM

## 2020-09-05 DIAGNOSIS — M542 Cervicalgia: Secondary | ICD-10-CM

## 2020-09-05 LAB — POCT GLYCOSYLATED HEMOGLOBIN (HGB A1C): HbA1c, POC (prediabetic range): 6.2 % (ref 5.7–6.4)

## 2020-09-05 MED ORDER — BACLOFEN 10 MG PO TABS: 10.0000 mg | ORAL_TABLET | Freq: Three times a day (TID) | ORAL | 0 refills | Status: AC | PRN

## 2020-09-05 MED ORDER — NAPROXEN 500 MG PO TABS: 500.0000 mg | ORAL_TABLET | Freq: Two times a day (BID) | ORAL | 0 refills | Status: AC
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
## 2020-09-05 NOTE — Patient Instructions (Addendum)
It was wonderful to see you today.  It seems like you have irritated the arthritis in your neck that might be pinching on a nerve depending on positions.  You can use Tylenol 650 mg every 4-6 hours in addition to the naproxen I have prescribed.  Please make sure you take this medication with food and drink plenty of water.  You can also use a muscle relaxer, realized that this might make you drowsy so please do not drive with this.  I encourage you to keep trying to stretch your upper muscles as they are very tight.  This should hopefully improve in the next few weeks.  We could consider formal physical therapy in the future or you can also discuss this with your orthopedic surgeon.   Please follow-up if it is not improving in the next several weeks or sooner if it is becoming more severe, any associated weakness, persistent numbness.   [place] Dr. Anette Riedel -- orthopedic surgery.

## 2020-09-05 NOTE — Assessment & Plan Note (Signed)
A1c 6.2 today. Diet controlled. Will continue regimen as is.

## 2020-09-25 ENCOUNTER — Telehealth: Payer: Self-pay | Admitting: Sports Medicine

## 2020-09-25 ENCOUNTER — Telehealth: Payer: Self-pay

## 2020-09-25 NOTE — Telephone Encounter (Signed)
Patient spoke to [facility] earlier today about her upcoming surgery with Dr. Marylene Land on 09/30/20.Patient also had questions for a nurse. I returned her call but she did not answer. Left message advising to call back with her questions

## 2020-09-25 NOTE — Telephone Encounter (Signed)
Alexandra Randall or Alexandra Randall Will you check with the patient to see what ?s she has re: surgery  -Dr. Marylene Land

## 2020-09-25 NOTE — Telephone Encounter (Signed)
Ok thank you for calling her back

## 2020-09-25 NOTE — Telephone Encounter (Signed)
Patient has requested return call from nurse, has questions regarding surgery, Please advise

## 2020-09-25 NOTE — Telephone Encounter (Signed)
I left a message for Alexandra Randall to return my call.

## 2020-09-26 ENCOUNTER — Telehealth: Payer: Self-pay | Admitting: Family Medicine

## 2020-09-26 ENCOUNTER — Telehealth: Payer: Self-pay

## 2020-09-26 NOTE — Telephone Encounter (Signed)
She was last seen for her ear 2 months ago at the beginning of October and did not mention this during our visit in November.   Given it has been such an extended time, please have her schedule an appointment for a re-evaluation of her ears. Thank you!   Allayne Stack, DO

## 2020-09-26 NOTE — Telephone Encounter (Signed)
Patient calling regarding her ear infection she was seen for a few weeks back. Patient states the medication she was given has not helped and her ear(s) still hurt. She wonders if something different can be called in. Please call patient with any questions or concerns.

## 2020-09-26 NOTE — Telephone Encounter (Signed)
DOS 09/30/2020  [place] OSTEOTOMY RT - 82993 [place] BUNIONECTOMY RT - 28296 METATARSAL OSTEOTOMY 2ND RT - 28308 EPF RT - 71696 HAMMERTOE REPAIR 2NR RT - 78938   [employer]BS EFFECTIVE DATE - 10/26/2017  PLAN DEDUCTIBLE - $2900.00 W/ $0.00 REMAINING OUT OF POCKET - $5300.00 W/ $2360.00 REMAINING COPAY $0.00 COINSURANCE - 90%  CPT 29893 DOES NOT REQUIRE PRIOR AUTH  CPT 28310, 28296, 28308 & 28285 APPROVED, AUTH # [id number] GOOD FROM 09/30/2020 - 11/28/2020

## 2020-09-27 ENCOUNTER — Telehealth: Payer: Self-pay | Admitting: Sports Medicine

## 2020-09-27 NOTE — Telephone Encounter (Signed)
Attempted to call.  No answer and no machine. Caldonia Leap, CMA  

## 2020-09-27 NOTE — Telephone Encounter (Signed)
Called patient to rely message from [facility], patient can speak with any nurse regarding questions about surgery

## 2020-09-27 NOTE — Telephone Encounter (Signed)
I spoke with Alexandra Randall. Patient wanted to know if Dr. Marylene Land needed to work on the bottom of her foot with all the other things she was doing. I told patient that instead of her coming back in for another surgery, that its best to have it all done at one time. Alexandra Randall, Thanked me for the assistance and hung up.

## 2020-09-28 ENCOUNTER — Other Ambulatory Visit: Payer: Self-pay | Admitting: Sports Medicine

## 2020-09-28 NOTE — Progress Notes (Signed)
Post op meds entered 

## 2020-09-30 ENCOUNTER — Encounter: Payer: Self-pay | Admitting: Sports Medicine

## 2020-09-30 DIAGNOSIS — M21541 Acquired clubfoot, right foot: Secondary | ICD-10-CM | POA: Diagnosis not present

## 2020-09-30 DIAGNOSIS — M2011 Hallux valgus (acquired), right foot: Secondary | ICD-10-CM | POA: Diagnosis not present

## 2020-09-30 DIAGNOSIS — M722 Plantar fascial fibromatosis: Secondary | ICD-10-CM | POA: Diagnosis not present

## 2020-09-30 DIAGNOSIS — M205X1 Other deformities of toe(s) (acquired), right foot: Secondary | ICD-10-CM | POA: Diagnosis not present

## 2020-09-30 DIAGNOSIS — M7741 Metatarsalgia, right foot: Secondary | ICD-10-CM | POA: Diagnosis not present

## 2020-09-30 DIAGNOSIS — M2041 Other hammer toe(s) (acquired), right foot: Secondary | ICD-10-CM | POA: Diagnosis not present

## 2020-09-30 DIAGNOSIS — M25571 Pain in right ankle and joints of right foot: Secondary | ICD-10-CM | POA: Diagnosis not present

## 2020-09-30 MED ORDER — OXYCODONE-ACETAMINOPHEN 10-325 MG PO TABS: 1.0000 | ORAL_TABLET | Freq: Four times a day (QID) | ORAL | 0 refills | Status: AC | PRN

## 2020-09-30 MED ORDER — DOCUSATE SODIUM 100 MG PO CAPS: 100.0000 mg | ORAL_CAPSULE | Freq: Two times a day (BID) | ORAL | 0 refills | Status: AC

## 2020-09-30 MED ORDER — PROMETHAZINE HCL 12.5 MG PO TABS: 12.5000 mg | ORAL_TABLET | Freq: Three times a day (TID) | ORAL | 0 refills | Status: AC | PRN

## 2020-09-30 MED ORDER — IBUPROFEN 800 MG PO TABS: 800.0000 mg | ORAL_TABLET | Freq: Three times a day (TID) | ORAL | 0 refills | Status: AC | PRN

## 2020-10-01 ENCOUNTER — Other Ambulatory Visit: Payer: Self-pay

## 2020-10-01 ENCOUNTER — Ambulatory Visit (INDEPENDENT_AMBULATORY_CARE_PROVIDER_SITE_OTHER): Payer: BC Managed Care – PPO | Admitting: Podiatry

## 2020-10-01 ENCOUNTER — Ambulatory Visit: Payer: BC Managed Care – PPO | Admitting: Sports Medicine

## 2020-10-01 DIAGNOSIS — M21611 Bunion of right foot: Secondary | ICD-10-CM | POA: Diagnosis not present

## 2020-10-01 DIAGNOSIS — Z9889 Other specified postprocedural states: Secondary | ICD-10-CM

## 2020-10-01 DIAGNOSIS — M2040 Other hammer toe(s) (acquired), unspecified foot: Secondary | ICD-10-CM

## 2020-10-01 DIAGNOSIS — M21619 Bunion of unspecified foot: Secondary | ICD-10-CM

## 2020-10-01 NOTE — Progress Notes (Signed)
  Subjective:  Patient ID: Alexandra Randall, female    DOB: 04-May-1966,  MRN: [id number]  Chief Complaint  Patient presents with  . Post-op Problem    Pt states active bleeding through bandages, "I am in a lot of pain and the medication does not help"    DOS: 09/30/2020 Procedure: [place] bunionectomy, 2nd hammertoe correction, metatarsal osteotomy, EPF  54 y.o. female presents with the above complaint. History confirmed with patient.   Objective:  Physical Exam: tenderness at the surgical site, local edema noted and calf supple, nontender. Incision: Dressing not fully removed.   Assessment:   1. Bunion   2. Hammer toe, unspecified laterality   3. Post-operative state     Plan:  Patient was evaluated and treated and all questions answered.  Post-operative State -She does have fresh blood but no signs of active bleeding. Dressing applied consisting of sterile gauze, kerlix and ACE bandage. -NWB with crutches. I do think she is putting weight through it and so I dispesed a boot today. This does not change her WB status.  No follow-ups on file.

## 2020-10-10 ENCOUNTER — Ambulatory Visit (INDEPENDENT_AMBULATORY_CARE_PROVIDER_SITE_OTHER): Payer: BC Managed Care – PPO | Admitting: Sports Medicine

## 2020-10-10 ENCOUNTER — Other Ambulatory Visit: Payer: Self-pay

## 2020-10-10 ENCOUNTER — Ambulatory Visit (INDEPENDENT_AMBULATORY_CARE_PROVIDER_SITE_OTHER): Payer: BC Managed Care – PPO

## 2020-10-10 ENCOUNTER — Encounter: Payer: Self-pay | Admitting: Sports Medicine

## 2020-10-10 DIAGNOSIS — M722 Plantar fascial fibromatosis: Secondary | ICD-10-CM

## 2020-10-10 DIAGNOSIS — M2040 Other hammer toe(s) (acquired), unspecified foot: Secondary | ICD-10-CM

## 2020-10-10 DIAGNOSIS — M2041 Other hammer toe(s) (acquired), right foot: Secondary | ICD-10-CM

## 2020-10-10 DIAGNOSIS — M79671 Pain in right foot: Secondary | ICD-10-CM

## 2020-10-10 DIAGNOSIS — E119 Type 2 diabetes mellitus without complications: Secondary | ICD-10-CM

## 2020-10-10 DIAGNOSIS — M21619 Bunion of unspecified foot: Secondary | ICD-10-CM

## 2020-10-10 DIAGNOSIS — Z9889 Other specified postprocedural states: Secondary | ICD-10-CM

## 2020-10-10 MED ORDER — IBUPROFEN 800 MG PO TABS: 800.0000 mg | ORAL_TABLET | Freq: Three times a day (TID) | ORAL | 0 refills | Status: AC | PRN

## 2020-10-10 MED ORDER — HYDROMORPHONE HCL 4 MG PO TABS
4.0000 mg | ORAL_TABLET | Freq: Four times a day (QID) | ORAL | 0 refills | Status: DC | PRN
Start: 2020-10-10 — End: 2020-10-17

## 2020-10-10 NOTE — Progress Notes (Signed)
Subjective: Alexandra Randall is a 54 y.o. female patient seen today in office for POV #1 (DOS 09-30-20), S/P Right buionectomy, 2nd hammetoe with metatarsal osteotomy, EPF.  Patient admits pain at surgical site, denies calf pain, denies headache, chest pain, shortness of breath, nausea, vomiting, fever, or chills. Patient is assisted by son and husband at this visit. No other issues noted.   Patient Active Problem List   Diagnosis Date Noted  . Cervical radicular pain 09/05/2020  . Vitamin D deficiency 04/23/2020  . Type 2 diabetes mellitus without complications ([place]) 93/23/5573  . Foot callus 03/12/2020  . Hypertension associated with diabetes ([place]) 03/12/2020  . Pain in right knee 01/19/2020  . Neck pain 08/18/2019  . Gastroesophageal reflux disease 06/16/2019  . Smokes 02/03/2018  . Disorder of intervertebral disc of lumbar spine 01/11/2017  . Allergic rhinitis 08/13/2016  . Hyperlipidemia   . Graves disease     Current Outpatient Medications on File Prior to Visit  Medication Sig Dispense Refill  . atorvastatin (LIPITOR) 40 MG tablet Take 40 mg by mouth daily.     . baclofen (LIORESAL) 10 MG tablet Take 1 tablet (10 mg total) by mouth 3 (three) times daily as needed for muscle spasms. Try 1/2 tablet first 30 each 0  . Blood Glucose Monitoring Suppl (ONE TOUCH ULTRA 2) w/Device KIT USE AS DIRECTED TO CHECK BLOOD SUGAR DAILY  0  . Cholecalciferol (VITAMIN D) 2000 units CAPS Take by mouth.    . docusate sodium (COLACE) 100 MG capsule Take 1 capsule (100 mg total) by mouth 2 (two) times daily. 10 capsule 0  . ibuprofen (ADVIL) 800 MG tablet Take 1 tablet (800 mg total) by mouth every 8 (eight) hours as needed. 30 tablet 0  . ketoconazole (NIZORAL) 2 % cream Apply 1 application topically daily. 30 g 5  . lisinopril-hydrochlorothiazide (PRINZIDE,ZESTORETIC) 20-12.5 MG tablet Take 1 tablet by mouth daily.  1  . loratadine (CLARITIN) 10 MG tablet Take 1 tablet (10 mg total) by mouth daily. 60  tablet 2  . methimazole (TAPAZOLE) 5 MG tablet Take 5 mg by mouth daily.    . ONE TOUCH ULTRA TEST test strip USE AS DIRECTED TO CHECK BLOOD SUGAR DAILY  6  . Probiotic Product (PROBIOTIC PO) Take by mouth daily.    . promethazine (PHENERGAN) 12.5 MG tablet Take 1 tablet (12.5 mg total) by mouth every 8 (eight) hours as needed for nausea or vomiting. 20 tablet 0   No current facility-administered medications on file prior to visit.    Allergies  Allergen Reactions  . Hydrocodone Other (See Comments)    Heart palpitations     Objective: There were no vitals filed for this visit.  General: No acute distress, AAOx3  Right foot: Kwire and Sutures intact with no gapping or dehiscence at surgical site, mild to moderate swelling to right foot, no erythema, no warmth, no drainage, no signs of infection noted, Capillary fill time <3 seconds in all digits, gross sensation present via light touch to right foot. Guarding due to pain on right.  No pain with calf compression.   Post Op Xray, Right foot: Excellent alignment and position. Osteotomy site healing. Hardware intact. Soft tissue swelling within normal limits for post op status.   Assessment and Plan:  Problem List Items Addressed This Visit      Endocrine   Type 2 diabetes mellitus without complications ([place])    Other Visit Diagnoses    Hammer toe, unspecified laterality    -  Primary   Hammertoe of right foot       Relevant Orders   DG Foot Complete Right   Bunion       Plantar fasciitis       Post-operative state       Right foot pain           -Patient seen and evaluated -Applied dry sterile dressing to surgical site right foot secured with ACE wrap and stockinet  -Advised patient to make sure to keep dressings clean, dry, and intact to right surgical site, removing the ACE as needed  -Advised patient to continue with no weight to right foot until after heel sutures and toe K wire have been removed  -Refilled pain meds  motrin and changed percocet to dilaudid to see if this gives any additional pain relief -Advised patient to limit activity to necessity  -Advised patient to ice and elevate as necessary  -Will plan for possible suture removal at next office visit. In the meantime, patient to call office if any issues or problems arise.   Landis Martins, DPM

## 2020-10-14 ENCOUNTER — Other Ambulatory Visit: Payer: Self-pay | Admitting: Family Medicine

## 2020-10-17 ENCOUNTER — Other Ambulatory Visit: Payer: Self-pay

## 2020-10-17 ENCOUNTER — Ambulatory Visit (INDEPENDENT_AMBULATORY_CARE_PROVIDER_SITE_OTHER): Payer: BC Managed Care – PPO | Admitting: Sports Medicine

## 2020-10-17 DIAGNOSIS — M2041 Other hammer toe(s) (acquired), right foot: Secondary | ICD-10-CM

## 2020-10-17 DIAGNOSIS — Z9889 Other specified postprocedural states: Secondary | ICD-10-CM

## 2020-10-17 DIAGNOSIS — M722 Plantar fascial fibromatosis: Secondary | ICD-10-CM

## 2020-10-17 DIAGNOSIS — M79671 Pain in right foot: Secondary | ICD-10-CM

## 2020-10-17 DIAGNOSIS — M21619 Bunion of unspecified foot: Secondary | ICD-10-CM

## 2020-10-17 DIAGNOSIS — K5904 Chronic idiopathic constipation: Secondary | ICD-10-CM | POA: Insufficient documentation

## 2020-10-17 MED ORDER — HYDROMORPHONE HCL 4 MG PO TABS: 4.0000 mg | ORAL_TABLET | Freq: Four times a day (QID) | ORAL | 0 refills | Status: AC | PRN

## 2020-10-18 ENCOUNTER — Encounter: Payer: Self-pay | Admitting: Sports Medicine

## 2020-10-18 NOTE — Progress Notes (Signed)
Subjective: Alexandra Randall is a 54 y.o. female patient seen today in office for POV #2 (DOS 09-30-20), S/P Right buionectomy, 2nd hammetoe with metatarsal osteotomy, EPF.  Patient admits pain at surgical site that is better than last week and swelling better than last week, denies calf pain, denies headache, chest pain, shortness of breath, nausea, vomiting, fever, or chills. Patient is assisted by son and husband at this visit. No other issues noted.   Patient Active Problem List   Diagnosis Date Noted  . Chronic idiopathic constipation 10/17/2020  . Cervical radicular pain 09/05/2020  . Vitamin D deficiency 04/23/2020  . Type 2 diabetes mellitus without complications ([place]) 36/62/9476  . Foot callus 03/12/2020  . Hypertension associated with diabetes ([place]) 03/12/2020  . Pain in right knee 01/19/2020  . Neck pain 08/18/2019  . Gastroesophageal reflux disease 06/16/2019  . Smokes 02/03/2018  . Disorder of intervertebral disc of lumbar spine 01/11/2017  . Allergic rhinitis 08/13/2016  . Hyperlipidemia   . Graves disease     Current Outpatient Medications on File Prior to Visit  Medication Sig Dispense Refill  . atorvastatin (LIPITOR) 40 MG tablet Take 40 mg by mouth daily.     . baclofen (LIORESAL) 10 MG tablet Take 1 tablet (10 mg total) by mouth 3 (three) times daily as needed for muscle spasms. Try 1/2 tablet first 30 each 0  . Blood Glucose Monitoring Suppl (ONE TOUCH ULTRA 2) w/Device KIT USE AS DIRECTED TO CHECK BLOOD SUGAR DAILY  0  . Cholecalciferol (VITAMIN D) 2000 units CAPS Take by mouth.    . docusate sodium (COLACE) 100 MG capsule Take 1 capsule (100 mg total) by mouth 2 (two) times daily. 10 capsule 0  . ibuprofen (ADVIL) 800 MG tablet Take 1 tablet (800 mg total) by mouth every 8 (eight) hours as needed. 30 tablet 0  . ibuprofen (ADVIL) 800 MG tablet Take 1 tablet (800 mg total) by mouth every 8 (eight) hours as needed. 30 tablet 0  . ketoconazole (NIZORAL) 2 % cream Apply  1 application topically daily. 30 g 5  . lisinopril-hydrochlorothiazide (ZESTORETIC) 20-12.5 MG tablet TAKE 1 TABLET BY MOUTH EVERY DAY 90 tablet 1  . loratadine (CLARITIN) 10 MG tablet Take 1 tablet (10 mg total) by mouth daily. 60 tablet 2  . methimazole (TAPAZOLE) 5 MG tablet TAKE 1 TABLET BY MOUTH EVERY DAY 90 tablet 1  . ONE TOUCH ULTRA TEST test strip USE AS DIRECTED TO CHECK BLOOD SUGAR DAILY  6  . Probiotic Product (PROBIOTIC PO) Take by mouth daily.    . promethazine (PHENERGAN) 12.5 MG tablet Take 1 tablet (12.5 mg total) by mouth every 8 (eight) hours as needed for nausea or vomiting. 20 tablet 0   No current facility-administered medications on file prior to visit.    Allergies  Allergen Reactions  . Hydrocodone Other (See Comments)    Heart palpitations     Objective: There were no vitals filed for this visit.  General: No acute distress, AAOx3  Right foot: Kwire and Sutures intact with no gapping or dehiscence at surgical site, mild  swelling to right foot, no erythema, no warmth, no drainage, no signs of infection noted, Capillary fill time <3 seconds in all digits, gross sensation present via light touch to right foot. Guarding due to pain on right.  No pain with calf compression.   Assessment and Plan:  Problem List Items Addressed This Visit   None   Visit Diagnoses  Post-operative state    -  Primary   Hammertoe of right foot       Bunion       Plantar fasciitis       Right foot pain           -Patient seen and evaluated -A few sutures were removed -Applied dry sterile dressing to surgical site right foot secured with ACE wrap and stockinet  -Advised patient to make sure to keep dressings clean, dry, and intact and to have husband to change next week -Advised patient to continue with no weight to right foot until after next visit -Refilled Dilaudid  -Advised patient to limit activity to necessity  -Advised patient to ice and elevate as necessary  -Will  plan for xrays, kwire and suture removal at next office visit. In the meantime, patient to call office if any issues or problems arise.   Landis Martins, DPM

## 2020-10-31 ENCOUNTER — Ambulatory Visit (INDEPENDENT_AMBULATORY_CARE_PROVIDER_SITE_OTHER): Payer: BC Managed Care – PPO

## 2020-10-31 ENCOUNTER — Other Ambulatory Visit: Payer: Self-pay

## 2020-10-31 ENCOUNTER — Encounter: Payer: Self-pay | Admitting: Sports Medicine

## 2020-10-31 ENCOUNTER — Ambulatory Visit (INDEPENDENT_AMBULATORY_CARE_PROVIDER_SITE_OTHER): Payer: BC Managed Care – PPO | Admitting: Sports Medicine

## 2020-10-31 DIAGNOSIS — M2041 Other hammer toe(s) (acquired), right foot: Secondary | ICD-10-CM | POA: Diagnosis not present

## 2020-10-31 DIAGNOSIS — Z9889 Other specified postprocedural states: Secondary | ICD-10-CM

## 2020-10-31 DIAGNOSIS — M722 Plantar fascial fibromatosis: Secondary | ICD-10-CM

## 2020-10-31 DIAGNOSIS — M21619 Bunion of unspecified foot: Secondary | ICD-10-CM

## 2020-10-31 DIAGNOSIS — M79671 Pain in right foot: Secondary | ICD-10-CM

## 2020-10-31 MED ORDER — HYDROMORPHONE HCL 4 MG PO TABS: 4.0000 mg | ORAL_TABLET | Freq: Four times a day (QID) | ORAL | 0 refills | Status: AC | PRN

## 2020-10-31 NOTE — Progress Notes (Signed)
Subjective: Alexandra Randall is a 55 y.o. female patient seen today in office for POV #3 (DOS 09-30-20), S/P Right buionectomy, 2nd hammetoe with metatarsal osteotomy, EPF.  Patient admits pain at surgical site that is better and not as bad as before. Denies constitional symptoms. No other issues noted.   Assisted by husband this visit.   Patient Active Problem List   Diagnosis Date Noted  . Chronic idiopathic constipation 10/17/2020  . Cervical radicular pain 09/05/2020  . Vitamin D deficiency 04/23/2020  . Type 2 diabetes mellitus without complications ([place]) 62/95/2841  . Foot callus 03/12/2020  . Hypertension associated with diabetes ([place]) 03/12/2020  . Pain in right knee 01/19/2020  . Neck pain 08/18/2019  . Gastroesophageal reflux disease 06/16/2019  . Smokes 02/03/2018  . Disorder of intervertebral disc of lumbar spine 01/11/2017  . Allergic rhinitis 08/13/2016  . Hyperlipidemia   . Graves disease     Current Outpatient Medications on File Prior to Visit  Medication Sig Dispense Refill  . atorvastatin (LIPITOR) 40 MG tablet Take 40 mg by mouth daily.     . baclofen (LIORESAL) 10 MG tablet Take 1 tablet (10 mg total) by mouth 3 (three) times daily as needed for muscle spasms. Try 1/2 tablet first 30 each 0  . Blood Glucose Monitoring Suppl (ONE TOUCH ULTRA 2) w/Device KIT USE AS DIRECTED TO CHECK BLOOD SUGAR DAILY  0  . Cholecalciferol (VITAMIN D) 2000 units CAPS Take by mouth.    . docusate sodium (COLACE) 100 MG capsule Take 1 capsule (100 mg total) by mouth 2 (two) times daily. 10 capsule 0  . ibuprofen (ADVIL) 800 MG tablet Take 1 tablet (800 mg total) by mouth every 8 (eight) hours as needed. 30 tablet 0  . ibuprofen (ADVIL) 800 MG tablet Take 1 tablet (800 mg total) by mouth every 8 (eight) hours as needed. 30 tablet 0  . ketoconazole (NIZORAL) 2 % cream Apply 1 application topically daily. 30 g 5  . lisinopril-hydrochlorothiazide (ZESTORETIC) 20-12.5 MG tablet TAKE 1 TABLET  BY MOUTH EVERY DAY 90 tablet 1  . loratadine (CLARITIN) 10 MG tablet Take 1 tablet (10 mg total) by mouth daily. 60 tablet 2  . methimazole (TAPAZOLE) 5 MG tablet TAKE 1 TABLET BY MOUTH EVERY DAY 90 tablet 1  . ONE TOUCH ULTRA TEST test strip USE AS DIRECTED TO CHECK BLOOD SUGAR DAILY  6  . Probiotic Product (PROBIOTIC PO) Take by mouth daily.    . promethazine (PHENERGAN) 12.5 MG tablet Take 1 tablet (12.5 mg total) by mouth every 8 (eight) hours as needed for nausea or vomiting. 20 tablet 0   No current facility-administered medications on file prior to visit.    Allergies  Allergen Reactions  . Hydrocodone Other (See Comments)    Heart palpitations     Objective: There were no vitals filed for this visit.  General: No acute distress, AAOx3  Right foot: Kwire and Sutures intact with no gapping or dehiscence at surgical site, mild  swelling to right foot, no erythema, no warmth, no drainage, no signs of infection noted, Capillary fill time <3 seconds in all digits, gross sensation present via light touch to right foot. Guarding due to pain on right.  No pain with calf compression.   Xrays consistent with post op status.   Assessment and Plan:  Problem List Items Addressed This Visit   None   Visit Diagnoses    Hammertoe of right foot    -  Primary   Relevant Orders   DG Foot Complete Right   Bunion       Plantar fasciitis       Post-operative state       Right foot pain           -Patient seen and evaluated -X-rays reviewed consistent with postoperative status -K wire removed from second toe and remaining sutures removed apply dry dressing for patient to keep intact for today may remove dressing on tomorrow for shower and redress with a clean sock -May weight-bear with surgical shoe and cane -Refilled Dilaudid  -Advised patient to limit activity to necessity  -Advised patient to ice and elevate as instructed -Will plan for postoperative check and adding on physical  therapy at next office visit.  In the meantime, patient to call office if any issues or problems arise.   Landis Martins, DPM

## 2020-11-21 ENCOUNTER — Encounter: Payer: BC Managed Care – PPO | Admitting: Sports Medicine

## 2020-11-25 DIAGNOSIS — R5383 Other fatigue: Secondary | ICD-10-CM | POA: Diagnosis not present

## 2020-11-25 DIAGNOSIS — R1013 Epigastric pain: Secondary | ICD-10-CM | POA: Diagnosis not present

## 2020-11-25 DIAGNOSIS — E0590 Thyrotoxicosis, unspecified without thyrotoxic crisis or storm: Secondary | ICD-10-CM | POA: Diagnosis not present

## 2020-11-25 DIAGNOSIS — E559 Vitamin D deficiency, unspecified: Secondary | ICD-10-CM | POA: Diagnosis not present

## 2020-12-05 ENCOUNTER — Other Ambulatory Visit: Payer: Self-pay

## 2020-12-05 ENCOUNTER — Ambulatory Visit (INDEPENDENT_AMBULATORY_CARE_PROVIDER_SITE_OTHER): Payer: BC Managed Care – PPO | Admitting: Sports Medicine

## 2020-12-05 DIAGNOSIS — E119 Type 2 diabetes mellitus without complications: Secondary | ICD-10-CM

## 2020-12-05 DIAGNOSIS — M21619 Bunion of unspecified foot: Secondary | ICD-10-CM

## 2020-12-05 DIAGNOSIS — M792 Neuralgia and neuritis, unspecified: Secondary | ICD-10-CM

## 2020-12-05 DIAGNOSIS — Z9889 Other specified postprocedural states: Secondary | ICD-10-CM

## 2020-12-05 DIAGNOSIS — M79671 Pain in right foot: Secondary | ICD-10-CM

## 2020-12-05 DIAGNOSIS — M2041 Other hammer toe(s) (acquired), right foot: Secondary | ICD-10-CM

## 2020-12-05 MED ORDER — HYDROMORPHONE HCL 2 MG PO TABS: 2.0000 mg | ORAL_TABLET | Freq: Four times a day (QID) | ORAL | 0 refills | Status: AC | PRN

## 2020-12-05 MED ORDER — GABAPENTIN 300 MG PO CAPS: 300.0000 mg | ORAL_CAPSULE | Freq: Every day | ORAL | 3 refills | Status: DC

## 2020-12-05 NOTE — Progress Notes (Signed)
Subjective: Alexandra Randall is a 55 y.o. female patient seen today in office for POV #4 (DOS 09-30-20), S/P Right buionectomy, 2nd hammetoe with metatarsal osteotomy, EPF.  Patient admits pain at surgical site that was bad the last 2 nights had a hard time sleeping, lots of sharp shooting pain to toes. Denies constitional symptoms. No other issues noted.   Patient Active Problem List   Diagnosis Date Noted  . Chronic idiopathic constipation 10/17/2020  . Cervical radicular pain 09/05/2020  . Vitamin D deficiency 04/23/2020  . Type 2 diabetes mellitus without complications ([place]) 72/04/2181  . Foot callus 03/12/2020  . Hypertension associated with diabetes ([place]) 03/12/2020  . Pain in right knee 01/19/2020  . Neck pain 08/18/2019  . Gastroesophageal reflux disease 06/16/2019  . Smokes 02/03/2018  . Disorder of intervertebral disc of lumbar spine 01/11/2017  . Allergic rhinitis 08/13/2016  . Hyperlipidemia   . Graves disease     Current Outpatient Medications on File Prior to Visit  Medication Sig Dispense Refill  . atorvastatin (LIPITOR) 40 MG tablet Take 40 mg by mouth daily.     . baclofen (LIORESAL) 10 MG tablet Take 1 tablet (10 mg total) by mouth 3 (three) times daily as needed for muscle spasms. Try 1/2 tablet first 30 each 0  . Blood Glucose Monitoring Suppl (ONE TOUCH ULTRA 2) w/Device KIT USE AS DIRECTED TO CHECK BLOOD SUGAR DAILY  0  . Cholecalciferol (VITAMIN D) 2000 units CAPS Take by mouth.    . docusate sodium (COLACE) 100 MG capsule Take 1 capsule (100 mg total) by mouth 2 (two) times daily. 10 capsule 0  . ibuprofen (ADVIL) 800 MG tablet Take 1 tablet (800 mg total) by mouth every 8 (eight) hours as needed. 30 tablet 0  . ibuprofen (ADVIL) 800 MG tablet Take 1 tablet (800 mg total) by mouth every 8 (eight) hours as needed. 30 tablet 0  . ketoconazole (NIZORAL) 2 % cream Apply 1 application topically daily. 30 g 5  . lisinopril-hydrochlorothiazide (ZESTORETIC) 20-12.5 MG  tablet TAKE 1 TABLET BY MOUTH EVERY DAY 90 tablet 1  . loratadine (CLARITIN) 10 MG tablet Take 1 tablet (10 mg total) by mouth daily. 60 tablet 2  . methimazole (TAPAZOLE) 5 MG tablet TAKE 1 TABLET BY MOUTH EVERY DAY 90 tablet 1  . ONE TOUCH ULTRA TEST test strip USE AS DIRECTED TO CHECK BLOOD SUGAR DAILY  6  . Probiotic Product (PROBIOTIC PO) Take by mouth daily.    . promethazine (PHENERGAN) 12.5 MG tablet Take 1 tablet (12.5 mg total) by mouth every 8 (eight) hours as needed for nausea or vomiting. 20 tablet 0   No current facility-administered medications on file prior to visit.    Allergies  Allergen Reactions  . Hydrocodone Other (See Comments)    Heart palpitations     Objective: There were no vitals filed for this visit.  General: No acute distress, AAOx3  Right foot:Incision healing healing well at surgical site, mild  swelling to right foot, no erythema, no warmth, no drainage, no signs of infection noted, Capillary fill time <3 seconds in all digits, gross sensation present via light touch to right foot. Guarding due to pain on right.  No pain with calf compression.   Assessment and Plan:  Problem List Items Addressed This Visit      Endocrine   Type 2 diabetes mellitus without complications ([place])    Other Visit Diagnoses    Bunion    -  Primary   Hammertoe of right foot       Post-operative state       Right foot pain       Neuritis           -Patient seen and evaluated -We will hold on PT right now since she has pain -Rx Gabapentin for shooting pain at night and refilled dialudid to take during the day  -Advised patient to continue with rest and protection with her CAM boot -Advised patient to limit activity to necessity  -Advised patient to ice and elevate as instructed -Will plan for xrays and adding on physical therapy at next office visit.  In the meantime, patient to call office if any issues or problems arise.   Landis Martins, DPM

## 2020-12-19 ENCOUNTER — Encounter: Payer: Self-pay | Admitting: Sports Medicine

## 2020-12-19 ENCOUNTER — Other Ambulatory Visit: Payer: Self-pay

## 2020-12-19 ENCOUNTER — Ambulatory Visit (INDEPENDENT_AMBULATORY_CARE_PROVIDER_SITE_OTHER): Payer: BC Managed Care – PPO | Admitting: Sports Medicine

## 2020-12-19 ENCOUNTER — Ambulatory Visit (INDEPENDENT_AMBULATORY_CARE_PROVIDER_SITE_OTHER): Payer: BC Managed Care – PPO

## 2020-12-19 DIAGNOSIS — M21611 Bunion of right foot: Secondary | ICD-10-CM | POA: Diagnosis not present

## 2020-12-19 DIAGNOSIS — E119 Type 2 diabetes mellitus without complications: Secondary | ICD-10-CM

## 2020-12-19 DIAGNOSIS — M79671 Pain in right foot: Secondary | ICD-10-CM

## 2020-12-19 DIAGNOSIS — M21619 Bunion of unspecified foot: Secondary | ICD-10-CM

## 2020-12-19 DIAGNOSIS — M2041 Other hammer toe(s) (acquired), right foot: Secondary | ICD-10-CM

## 2020-12-19 DIAGNOSIS — Z9889 Other specified postprocedural states: Secondary | ICD-10-CM

## 2020-12-19 MED ORDER — GABAPENTIN 300 MG PO CAPS: 300.0000 mg | ORAL_CAPSULE | Freq: Two times a day (BID) | ORAL | 3 refills | Status: AC

## 2020-12-19 NOTE — Progress Notes (Signed)
Subjective: Alexandra Randall is a 55 y.o. female patient seen today in office for POV #5 (DOS 09-30-20), S/P Right buionectomy, 2nd hammetoe with metatarsal osteotomy, EPF.  Patient admits pain at surgical site with still some sharp shooting pain to toes. Denies constitional symptoms. No other issues noted.   Patient Active Problem List   Diagnosis Date Noted  . Chronic idiopathic constipation 10/17/2020  . Cervical radicular pain 09/05/2020  . Vitamin D deficiency 04/23/2020  . Type 2 diabetes mellitus without complications ([place]) 35/59/7416  . Foot callus 03/12/2020  . Hypertension associated with diabetes ([place]) 03/12/2020  . Pain in right knee 01/19/2020  . Neck pain 08/18/2019  . Gastroesophageal reflux disease 06/16/2019  . Smokes 02/03/2018  . Disorder of intervertebral disc of lumbar spine 01/11/2017  . Allergic rhinitis 08/13/2016  . Hyperlipidemia   . Graves disease     Current Outpatient Medications on File Prior to Visit  Medication Sig Dispense Refill  . atorvastatin (LIPITOR) 40 MG tablet Take 40 mg by mouth daily.     . baclofen (LIORESAL) 10 MG tablet Take 1 tablet (10 mg total) by mouth 3 (three) times daily as needed for muscle spasms. Try 1/2 tablet first 30 each 0  . Blood Glucose Monitoring Suppl (ONE TOUCH ULTRA 2) w/Device KIT USE AS DIRECTED TO CHECK BLOOD SUGAR DAILY  0  . Cholecalciferol (VITAMIN D) 2000 units CAPS Take by mouth.    . docusate sodium (COLACE) 100 MG capsule Take 1 capsule (100 mg total) by mouth 2 (two) times daily. 10 capsule 0  . gabapentin (NEURONTIN) 300 MG capsule Take 1 capsule (300 mg total) by mouth at bedtime. 90 capsule 3  . ibuprofen (ADVIL) 800 MG tablet Take 1 tablet (800 mg total) by mouth every 8 (eight) hours as needed. 30 tablet 0  . ibuprofen (ADVIL) 800 MG tablet Take 1 tablet (800 mg total) by mouth every 8 (eight) hours as needed. 30 tablet 0  . ketoconazole (NIZORAL) 2 % cream Apply 1 application topically daily. 30 g 5  .  lisinopril-hydrochlorothiazide (ZESTORETIC) 20-12.5 MG tablet TAKE 1 TABLET BY MOUTH EVERY DAY 90 tablet 1  . loratadine (CLARITIN) 10 MG tablet Take 1 tablet (10 mg total) by mouth daily. 60 tablet 2  . methimazole (TAPAZOLE) 5 MG tablet TAKE 1 TABLET BY MOUTH EVERY DAY 90 tablet 1  . ONE TOUCH ULTRA TEST test strip USE AS DIRECTED TO CHECK BLOOD SUGAR DAILY  6  . Probiotic Product (PROBIOTIC PO) Take by mouth daily.    . promethazine (PHENERGAN) 12.5 MG tablet Take 1 tablet (12.5 mg total) by mouth every 8 (eight) hours as needed for nausea or vomiting. 20 tablet 0   No current facility-administered medications on file prior to visit.    Allergies  Allergen Reactions  . Hydrocodone Other (See Comments)    Heart palpitations     Objective: There were no vitals filed for this visit.  General: No acute distress, AAOx3  Right foot:Incision healing healing well at surgical site, mild swelling to right foot, no erythema, no warmth, no drainage, no signs of infection noted, Capillary fill time <3 seconds in all digits, gross sensation present via light touch to right foot. Guarding due to pain on right. Subjective sharp pains at night at toes and to ankle,  No pain with calf compression.   Assessment and Plan:  Problem List Items Addressed This Visit      Endocrine   Type 2 diabetes mellitus  without complications [facility])   Relevant Orders   Ambulatory referral to Physical Therapy    Other Visit Diagnoses    Bunion    -  Primary   Relevant Orders   DG Foot Complete Right (Completed)   Ambulatory referral to Physical Therapy   Hammertoe of right foot       Relevant Orders   Ambulatory referral to Physical Therapy   Post-operative state       Relevant Orders   Ambulatory referral to Physical Therapy   Right foot pain       Relevant Orders   Ambulatory referral to Physical Therapy       -Patient seen and evaluated -Xrays reviewed hardware intact, mild soft tissue swelling to the  right foot -Continue with Gabapentin for shooting pain to now take BID -Advised patient to continue with rest and protection with her CAM boot until she can start PT and they will work with transitioning her to a normal shoe -Advised patient to limit activity to necessity  -Advised patient to ice and elevate as instructed -Extended FMLA until 01/27/21 -Will plan for post op check next office visit.  In the meantime, patient to call office if any issues or problems arise.   Landis Martins, DPM

## 2020-12-23 DIAGNOSIS — M542 Cervicalgia: Secondary | ICD-10-CM | POA: Diagnosis not present

## 2020-12-23 DIAGNOSIS — Z79899 Other long term (current) drug therapy: Secondary | ICD-10-CM | POA: Diagnosis not present

## 2020-12-23 DIAGNOSIS — E0590 Thyrotoxicosis, unspecified without thyrotoxic crisis or storm: Secondary | ICD-10-CM | POA: Diagnosis not present

## 2020-12-23 DIAGNOSIS — E1140 Type 2 diabetes mellitus with diabetic neuropathy, unspecified: Secondary | ICD-10-CM | POA: Diagnosis not present

## 2021-01-01 ENCOUNTER — Telehealth: Payer: Self-pay | Admitting: *Deleted

## 2021-01-01 ENCOUNTER — Ambulatory Visit: Payer: Self-pay | Admitting: Physical Therapy

## 2021-01-01 NOTE — Telephone Encounter (Signed)
error 

## 2021-01-01 NOTE — Telephone Encounter (Signed)
Patient is calling because her current insurance has been cancelled by her existing job and the Physical Therapy visits are too expensive. She would like to know if there is a cheaper facility since she has to pay out of pocket until her new insurance kicks in on April 1st.Please advise.

## 2021-01-01 NOTE — Telephone Encounter (Signed)
Called and gave information to patient per Dr Eilene Ghazi verbalized understanding and said that she will call them to ask.

## 2021-01-01 NOTE — Telephone Encounter (Signed)
No there isnt but she can try to speak with Physical therapy to see if they can work out a payment plan since she no longer has insurance. -Dr. Marylene Land

## 2021-01-03 ENCOUNTER — Other Ambulatory Visit: Payer: Self-pay

## 2021-01-03 ENCOUNTER — Ambulatory Visit: Payer: Self-pay | Attending: Sports Medicine | Admitting: Physical Therapy

## 2021-01-03 ENCOUNTER — Encounter: Payer: Self-pay | Admitting: Physical Therapy

## 2021-01-03 DIAGNOSIS — R2689 Other abnormalities of gait and mobility: Secondary | ICD-10-CM | POA: Insufficient documentation

## 2021-01-03 DIAGNOSIS — M79671 Pain in right foot: Secondary | ICD-10-CM | POA: Insufficient documentation

## 2021-01-03 DIAGNOSIS — M25671 Stiffness of right ankle, not elsewhere classified: Secondary | ICD-10-CM | POA: Insufficient documentation

## 2021-01-04 ENCOUNTER — Encounter: Payer: Self-pay | Admitting: Physical Therapy

## 2021-01-04 NOTE — Patient Instructions (Signed)
Access Code: [email or web address] by: Ezekiel Ina Notes touch with the softest material you have in the house for a few seconds. Build the time you can tolerate it up . Move to a little more rough texture example: silk-> cotton ball-soft wash cloth Exercises  Seated Ankle Pumps - 3 x daily - 7 x weekly - 2 sets - 10 reps  Supine Ankle Inversion Eversion AROM - 3 x daily - 7 x weekly - 2 sets - 10 reps  Long Sitting Calf Stretch with Strap - 3 x daily - 7 x weekly - 1 sets - 3 reps - 20 hold

## 2021-01-04 NOTE — Therapy (Signed)
[facility] [address] [place], [place], [address] Phone: [phone]   Fax:  [phone]  Physical Therapy Evaluation  Patient Details  Name: Alexandra Randall MRN: [id number] Date of Birth: [birth year] Referring Provider (PT): Dr Asencion Islamitorya Stover   Encounter Date: 01/03/2021   PT End of Session - 01/04/21 0710    Visit Number 1    Number of Visits 12    Date for PT Re-Evaluation 03/15/21    Authorization Type Patient can not start until April first    PT Start Time 1015    PT Stop Time 1058    PT Time Calculation (min) 43 min    Activity Tolerance Patient tolerated treatment well    Behavior During Therapy [facility] for tasks assessed/performed           Past Medical History:  Diagnosis Date  . Allergic rhinitis   . Diabetes mellitus without complication (HCC)   . Eczema   . GERD (gastroesophageal reflux disease)   . Graves disease   . Heart murmur   . Hyperlipidemia   . Hyperthyroidism   . Left wrist pain 08/18/2019  . Pansinusitis   . Sinusitis   . Thoracic back pain 03/12/2020  . Vitamin D deficiency     Past Surgical History:  Procedure Laterality Date  . CHOLECYSTECTOMY    . MASS EXCISION Right 04/03/2014   Procedure: EXCISION MASS RIGHT LOWER BACK;  Surgeon: Shelly Rubensteinouglas A Blackman, MD;  Location: [facility] OR;  Service: General;  Laterality: Right;  . OVARIAN CYST REMOVAL      There were no vitals filed for this visit.    Subjective Assessment - 01/03/21 1022    Subjective Patient had a bunioectomy and fixation on 09/30/2020. She continues to have pain in her big toe and the second toe. She has had significant pain since the surgery. She has pain in her operative toes as well as in her ankle joint. She currently wears a surgical shoe. She can not wear a shoe without significant pain.    Pertinent History DMII, Graves disease, thoracic pain    How long can you sit comfortably? sharp pain in the foot sitting    How long can  you stand comfortably? she has increased pain as soon as she puts her gfoot on the floor    How long can you walk comfortably? limited walking distances    Diagnostic tests nothing post op    Patient Stated Goals difficulty standing and walking    Currently in Pain? Yes    Pain Score 6     Pain Location Foot    Pain Orientation Right    Pain Descriptors / Indicators Aching    Pain Type Surgical pain    Pain Radiating Towards can have pain in the ankle as well    Pain Onset More than a month ago    Pain Frequency Constant    Aggravating Factors  standing, walking, sitting too long    Pain Relieving Factors ice and pain pills    Effect of Pain on Daily Activities difficulty standing and walking              [facility] PT Assessment - 01/04/21 0001      Assessment   Medical Diagnosis Right Bunionectomy    Referring Provider (PT) Dr Asencion Islamitorya Stover    Onset Date/Surgical Date 01/23/21    Hand Dominance Right    Next MD Visit Nothing scheduled    Prior Therapy  None      Precautions   Precautions None      Restrictions   Weight Bearing Restrictions No    Other Position/Activity Restrictions wearing a surgical shoe      Balance Screen   Has the patient fallen in the past 6 months No    Has the patient had a decrease in activity level because of a fear of falling?  No    Is the patient reluctant to leave their home because of a fear of falling?  No      Home Environment   Additional Comments 3 sptes into the house      Prior Function   Level of Independence Independent    Vocation Unemployed    Vocation Requirements Patient is out of work because of her foot and knee replacement      Cognition   Overall Cognitive Status Within Functional Limits for tasks assessed    Attention Focused    Focused Attention Appears intact    Memory Appears intact    Awareness Appears intact    Problem Solving Appears intact      Observation/Other Assessments   Skin Integrity trophic  changes noted in first and second toes    Focus on Therapeutic Outcomes (FOTO)  26% ability      Sensation   Light Touch Appears Intact    Additional Comments just pain      Coordination   Gross Motor Movements are Fluid and Coordinated Yes    Fine Motor Movements are Fluid and Coordinated Yes      AROM   Right/Left Ankle Right    Right Ankle Dorsiflexion -10    Right Ankle Plantar Flexion 20    Right Ankle Inversion 6    Right Ankle Eversion 10      PROM   PROM Assessment Site Ankle    Right/Left Ankle Right    Right Ankle Dorsiflexion -5    Right Ankle Plantar Flexion 25      Strength   Strength Assessment Site Ankle    Right/Left Ankle Right;Left    Right Ankle Dorsiflexion 4/5    Right Ankle Plantar Flexion 4/5   unable to test 2nd to pain   Right Ankle Inversion 3/5    Left Ankle Dorsiflexion 5/5    Left Ankle Plantar Flexion 5/5    Left Ankle Inversion 5/5    Left Ankle Eversion 5/5      Palpation   Palpation comment unable to tolerate light touch and mobility to the first toes      Transfers   Comments slow transfer with weight into the left LE      Ambulation/Gait   Gait Comments decreased weight bearing on the right side; using surgical shoe                      Objective measurements completed on examination: See above findings.       [facility] Adult PT Treatment/Exercise - 01/04/21 0001      Exercises   Exercises Ankle      Ankle Exercises: Stretches   Other Stretch reviewed desensitization; long sitting gastroc stretch      Ankle Exercises: Seated   Other Seated Ankle Exercises ankle 4 way ROM x10                  PT Education - 01/04/21 0709    Education Details reviewed desnsitization; improtance of gastroc and toe mobility  Person(s) Educated Patient    Methods Explanation;Demonstration;Tactile cues;Verbal cues    Comprehension Verbalized understanding;Returned demonstration;Verbal cues required;Tactile cues required             PT Short Term Goals - 01/04/21 0729      PT SHORT TERM GOAL #1   Title Patient will tolerate light touch to toes to begin light ROM    Time 3    Period Weeks    Status New    Target Date 01/25/21      PT SHORT TERM GOAL #2   Title Patient will begin woearing a shoe    Time 3    Period Weeks    Status New    Target Date 01/25/21      PT SHORT TERM GOAL #3   Title Patient will increase passive DF to 5 degrees without pain    Time 3    Period Weeks    Status New    Target Date 01/25/21             PT Long Term Goals - 01/04/21 0732      PT LONG TERM GOAL #1   Title Patient will stand for 30 minutes without pain in order to perfrom ADL's    Time 6    Period Weeks    Status New    Target Date 02/15/21      PT LONG TERM GOAL #2   Title Patient will ambualte 1000' with <2/10 pain in order to perform daily activity    Time 6    Period Weeks    Status New    Target Date 02/15/21                  Plan - 01/04/21 4431    Clinical Impression Statement Patient is a 54 year old female S/P hammertoe correction on the 1st and 2nd toes. The patient continues to have sifniciant pain in those tow toes. She has limited mobility of those toes and limited DF of her ankle. She has pain in her subtalor area when she stands and walks. She has hypersensativity to light touch in those two toes. She was given desensitization activty to work on for the next few weeks. It will be difficulty to work on range until she has desensatized her toes. She can not come back until she has insurance. She was adivsed that improving DF and desensitizing will take time and to work hard on this over the next few weeks. When she comes back we will progress into weight bearing as tolerated.    Personal Factors and Comorbidities Comorbidity 1;Comorbidity 2    Comorbidities DMII, right Knee OA    Examination-Activity Limitations Stairs;Stand;Locomotion Level;Transfers;Sit     Examination-Participation Restrictions Cleaning;Meal Prep;Community Activity;Shop    Stability/Clinical Decision Making Evolving/Moderate complexity    Clinical Decision Making Moderate    Rehab Potential Good    PT Frequency 1x / week    PT Duration 6 weeks   when able to attend   PT Treatment/Interventions ADLs/Self Care Home Management;Cryotherapy;Iontophoresis 4mg /ml Dexamethasone;Moist Heat;Ultrasound;DME Instruction;Gait training;Stair training;Functional mobility training;Therapeutic activities;Therapeutic exercise;Neuromuscular re-education;Patient/family education;Manual techniques;Passive range of motion;Dry needling;Splinting;Taping;Scar mobilization    PT Next Visit Plan begin light mobility of toes if aptient can tolerate. Assess improvement in doris flexion; progress to resited exercises if tolerated. add standing weight shifts if able; consider seated ankle series depending on how she is doing.    PT Home Exercise Plan Access Code:  Prepared by: [email or web address]  Chelan Heringer    Program Notes    touch with the softest material you have in the house for  a few seconds. Build the time you can tolerate it up . Move to a little more rough texture example: silk-> cotton ball-soft wash cloth    Exercises  .Seated Ankle Pumps - 3 x daily - 7 x weekly - 2 sets - 10 reps  .Supine Ankle Inversion Eversion AROM - 3 x daily - 7 x weekly - 2 sets - 10 reps  .Long Sitting Calf Stretch with Strap - 3 x daily - 7 x weekly - 1 sets - 3 reps - 20 hold    Consulted and Agree with Plan of Care Patient           Patient will benefit from skilled therapeutic intervention in order to improve the following deficits and impairments:  Abnormal gait,Difficulty walking,Decreased range of motion,Decreased endurance,Decreased activity tolerance,Pain,Decreased strength,Decreased mobility,Impaired sensation  Visit Diagnosis: Pain in right foot  Other abnormalities of gait and mobility  Stiffness of right ankle, not  elsewhere classified     Problem List Patient Active Problem List   Diagnosis Date Noted  . Chronic idiopathic constipation 10/17/2020  . Cervical radicular pain 09/05/2020  . Vitamin D deficiency 04/23/2020  . Type 2 diabetes mellitus without complications (HCC) 03/12/2020  . Foot callus 03/12/2020  . Hypertension associated with diabetes (HCC) 03/12/2020  . Pain in right knee 01/19/2020  . Neck pain 08/18/2019  . Gastroesophageal reflux disease 06/16/2019  . Smokes 02/03/2018  . Disorder of intervertebral disc of lumbar spine 01/11/2017  . Allergic rhinitis 08/13/2016  . Hyperlipidemia   . Graves disease     Dessie Coma 01/04/2021, 7:40 AM  [facility] [address] [place], [place], [address] Phone: [phone]   Fax:  [phone]  Name: Alexandra Randall MRN: [id number] Date of Birth: Nov 23, 1965

## 2021-01-20 DIAGNOSIS — Z79899 Other long term (current) drug therapy: Secondary | ICD-10-CM | POA: Diagnosis not present

## 2021-01-20 DIAGNOSIS — R0602 Shortness of breath: Secondary | ICD-10-CM | POA: Diagnosis not present

## 2021-01-20 DIAGNOSIS — E1140 Type 2 diabetes mellitus with diabetic neuropathy, unspecified: Secondary | ICD-10-CM | POA: Diagnosis not present

## 2021-01-20 DIAGNOSIS — R5383 Other fatigue: Secondary | ICD-10-CM | POA: Diagnosis not present

## 2021-01-23 ENCOUNTER — Ambulatory Visit: Payer: Self-pay | Admitting: Sports Medicine

## 2021-01-23 ENCOUNTER — Other Ambulatory Visit: Payer: Self-pay

## 2021-01-24 ENCOUNTER — Encounter: Payer: Self-pay | Admitting: Sports Medicine

## 2021-01-24 ENCOUNTER — Telehealth (INDEPENDENT_AMBULATORY_CARE_PROVIDER_SITE_OTHER): Payer: Self-pay | Admitting: Sports Medicine

## 2021-01-24 DIAGNOSIS — Z9889 Other specified postprocedural states: Secondary | ICD-10-CM

## 2021-01-24 DIAGNOSIS — E119 Type 2 diabetes mellitus without complications: Secondary | ICD-10-CM

## 2021-01-24 DIAGNOSIS — M2041 Other hammer toe(s) (acquired), right foot: Secondary | ICD-10-CM

## 2021-01-24 DIAGNOSIS — M21619 Bunion of unspecified foot: Secondary | ICD-10-CM

## 2021-01-24 DIAGNOSIS — M792 Neuralgia and neuritis, unspecified: Secondary | ICD-10-CM

## 2021-01-24 DIAGNOSIS — M79671 Pain in right foot: Secondary | ICD-10-CM

## 2021-01-24 NOTE — Progress Notes (Signed)
Virtual Visit via Telephone Note  I connected with Alexandra Randall on 01/24/21 at  8:00 AM EDT by telephone and verified that I am speaking with the correct person using two identifiers.  Location: Patient: Alexandra Randall home  Provider: Asencion Islam, DPM [facility]    I discussed the limitations, risks, security and privacy concerns of performing an evaluation and management service by telephone and the availability of in person appointments. I also discussed with the patient that there may be a patient responsible charge related to this service. The patient expressed understanding and agreed to proceed.   History of Present Illness: Status post right foot surgery was unable to see him prior to appointment on yesterday due to loss of insurance and cannot afford self-pay so I called patient to discuss with her plan of care and also to see how she is feeling since she is status post bunionectomy and second toe osteotomy and hammertoe repair.  Patient reports that she is doing better she is slowly getting more strength back in her foot went to 1 session of physical therapy but has other sessions set up on next week after she had to have her insurance reinstated.  Patient reports that she does have some swelling and some days the pain is very minimal while other days she has a lot of pain.  Patient reports that she has been resting and elevating which seems to help pain and swelling otherwise states that she is doing good denies any other pedal complaints at this time.   Observations/Objective: Physical exam unable to be performed due to telephone nature of visit  Assessment and Plan: Problem List Items Addressed This Visit      Endocrine   Type 2 diabetes mellitus without complications (HCC)    Other Visit Diagnoses    Bunion    -  Primary   Hammertoe of right foot       Post-operative state       Neuritis       Right foot pain         Advised patient to continue  with rest and elevation to assist with edema control Made patient well aware that swelling is normal and can be present for 6 months to a year after foot surgery Advised patient to continue with exercises as taught by physical therapy and to continue with scheduled sessions  Follow Up Instructions: Patient to call office for follow-up appointment in 1 month once her insurance has been reinstated   I discussed the assessment and treatment plan with the patient. The patient was provided an opportunity to ask questions and all were answered. The patient agreed with the plan and demonstrated an understanding of the instructions.   The patient was advised to call back or seek an in-person evaluation if the symptoms worsen or if the condition fails to improve as anticipated.  I provided 5 minutes of non-face-to-face time during this encounter.   Asencion Islam, DPM

## 2021-01-28 ENCOUNTER — Telehealth: Payer: Self-pay | Admitting: Sports Medicine

## 2021-01-28 NOTE — Telephone Encounter (Signed)
I talked to patient. She is concerned about returning to work. If she calls back she can speak to [place]. I informed her that we had to give her job an answer about her ability to return to work. She has been out more than 12 weeks and by law she can be fired since she has exhausted all of her FMLA. I told patient that I put her to return on light duty and if her job can not accomomdate her then she must file her disability or return or find another job. -Dr. Marylene Land

## 2021-01-28 NOTE — Telephone Encounter (Signed)
Patient called and stated she wanted to speak to Dr. Marylene Land as soon as she has a chance to call today.

## 2021-01-30 ENCOUNTER — Ambulatory Visit: Payer: PRIVATE HEALTH INSURANCE | Attending: Sports Medicine | Admitting: Physical Therapy

## 2021-01-30 ENCOUNTER — Encounter: Payer: Self-pay | Admitting: Physical Therapy

## 2021-01-30 ENCOUNTER — Other Ambulatory Visit: Payer: Self-pay

## 2021-01-30 DIAGNOSIS — M79671 Pain in right foot: Secondary | ICD-10-CM | POA: Diagnosis present

## 2021-01-30 DIAGNOSIS — R2689 Other abnormalities of gait and mobility: Secondary | ICD-10-CM | POA: Insufficient documentation

## 2021-01-30 DIAGNOSIS — M25671 Stiffness of right ankle, not elsewhere classified: Secondary | ICD-10-CM | POA: Diagnosis present

## 2021-01-30 NOTE — Therapy (Signed)
[facility] [address] [place], [place], [address] Phone: [phone]   Fax:  [phone]  Physical Therapy Treatment  Patient Details  Name: Alexandra Randall MRN: [id number] Date of Birth: 05-03-1966 Referring Provider (PT): Dr Asencion Islam   Encounter Date: 01/30/2021   PT End of Session - 01/30/21 1611    Visit Number 2    Number of Visits 12    Date for PT Re-Evaluation 03/15/21    Authorization Type Patient can not start until April first    PT Start Time 1030   Patient 15 minutes late   PT Stop Time 1100    PT Time Calculation (min) 30 min    Activity Tolerance Patient tolerated treatment well    Behavior During Therapy [facility] for tasks assessed/performed           Past Medical History:  Diagnosis Date  . Allergic rhinitis   . Diabetes mellitus without complication (HCC)   . Eczema   . GERD (gastroesophageal reflux disease)   . Graves disease   . Heart murmur   . Hyperlipidemia   . Hyperthyroidism   . Left wrist pain 08/18/2019  . Pansinusitis   . Sinusitis   . Thoracic back pain 03/12/2020  . Vitamin D deficiency     Past Surgical History:  Procedure Laterality Date  . CHOLECYSTECTOMY    . MASS EXCISION Right 04/03/2014   Procedure: EXCISION MASS RIGHT LOWER BACK;  Surgeon: Shelly Rubenstein, MD;  Location: [facility] OR;  Service: General;  Laterality: Right;  . OVARIAN CYST REMOVAL      There were no vitals filed for this visit.   Subjective Assessment - 01/30/21 1606    Subjective Patient reports her ankle has good days and bad days. On bad days she has significant pain, She has not been able to come 2nd to no -insurance. She reports she has been working on her exercises but she continues to have pain. Her ankle is sore today.    Pertinent History DMII, Graves disease, thoracic pain    How long can you sit comfortably? sharp pain in the foot sitting    How long can you stand comfortably? she has increased  pain as soon as she puts her gfoot on the floor    How long can you walk comfortably? limited walking distances    Diagnostic tests nothing post op    Patient Stated Goals difficulty standing and walking    Currently in Pain? Yes    Pain Score 3     Pain Location Foot    Pain Orientation Right    Pain Descriptors / Indicators Aching    Pain Type Chronic pain    Pain Onset More than a month ago    Pain Frequency Constant    Aggravating Factors  standing, walking, sitting for too long    Pain Relieving Factors ice and pain pills    Effect of Pain on Daily Activities difficulty standing and walking    Multiple Pain Sites No                             [facility] Adult PT Treatment/Exercise - 01/30/21 0001      Manual Therapy   Manual Therapy Joint mobilization;Soft tissue mobilization;Passive ROM    Manual therapy comments great toe ROM    Joint Mobilization posterior talor glides    Soft tissue mobilization to calf  Passive ROM DF strethcing      Ankle Exercises: Stretches   Other Stretch reviewed desensitization; long sitting gastroc stretch 3x0 sec hold      Ankle Exercises: Seated   Other Seated Ankle Exercises inversion/ eversion x20; PF with strap x20 yellow                  PT Education - 01/30/21 1611    Education Details improtance of DF stretching    Person(s) Educated Patient    Methods Explanation;Demonstration;Tactile cues;Verbal cues    Comprehension Verbalized understanding;Returned demonstration;Verbal cues required;Tactile cues required            PT Short Term Goals - 01/04/21 0729      PT SHORT TERM GOAL #1   Title Patient will tolerate light touch to toes to begin light ROM    Time 3    Period Weeks    Status New    Target Date 01/25/21      PT SHORT TERM GOAL #2   Title Patient will begin woearing a shoe    Time 3    Period Weeks    Status New    Target Date 01/25/21      PT SHORT TERM GOAL #3   Title Patient will  increase passive DF to 5 degrees without pain    Time 3    Period Weeks    Status New    Target Date 01/25/21             PT Long Term Goals - 01/04/21 0732      PT LONG TERM GOAL #1   Title Patient will stand for 30 minutes without pain in order to perfrom ADL's    Time 6    Period Weeks    Status New    Target Date 02/15/21      PT LONG TERM GOAL #2   Title Patient will ambualte 1000' with <2/10 pain in order to perform daily activity    Time 6    Period Weeks    Status New    Target Date 02/15/21                 Plan - 01/30/21 1612    Clinical Impression Statement Patient's great toe apperas less sensative but her DF is still very limited. Therapy advised her to shift focus of her HEP to DF stretching. Therapy worked on DF stretching today and soft tissue mobilization of the claf. She was shown how to do at home. her lateral gastroc is very tight. Her insurance has changed so hopefully sahe will be able to come back in more often for guidance through her program.    Personal Factors and Comorbidities Comorbidity 1;Comorbidity 2    Comorbidities DMII, right Knee OA    Examination-Activity Limitations Stairs;Stand;Locomotion Level;Transfers;Sit    Examination-Participation Restrictions Cleaning;Meal Prep;Community Activity;Shop    Stability/Clinical Decision Making Evolving/Moderate complexity    Clinical Decision Making Moderate    Rehab Potential Good    PT Frequency 1x / week    PT Duration 6 weeks    PT Treatment/Interventions ADLs/Self Care Home Management;Cryotherapy;Iontophoresis 4mg /ml Dexamethasone;Moist Heat;Ultrasound;DME Instruction;Gait training;Stair training;Functional mobility training;Therapeutic activities;Therapeutic exercise;Neuromuscular re-education;Patient/family education;Manual techniques;Passive range of motion;Dry needling;Splinting;Taping;Scar mobilization    PT Home Exercise Plan Access Code: [facility]  Prepared by: [facility]     Program Notes    touch with the softest material you have in the house for  a few seconds. Build the time  you can tolerate it up . Move to a little more rough texture example: silk-> cotton ball-soft wash cloth    Exercises  .Seated Ankle Pumps - 3 x daily - 7 x weekly - 2 sets - 10 reps  .Supine Ankle Inversion Eversion AROM - 3 x daily - 7 x weekly - 2 sets - 10 reps  .Long Sitting Calf Stretch with Strap - 3 x daily - 7 x weekly - 1 sets - 3 reps - 20 hold    Consulted and Agree with Plan of Care Patient           Patient will benefit from skilled therapeutic intervention in order to improve the following deficits and impairments:     Visit Diagnosis: Pain in right foot  Other abnormalities of gait and mobility  Stiffness of right ankle, not elsewhere classified     Problem List Patient Active Problem List   Diagnosis Date Noted  . Chronic idiopathic constipation 10/17/2020  . Cervical radicular pain 09/05/2020  . Vitamin D deficiency 04/23/2020  . Type 2 diabetes mellitus without complications (HCC) 03/12/2020  . Foot callus 03/12/2020  . Hypertension associated with diabetes (HCC) 03/12/2020  . Pain in right knee 01/19/2020  . Neck pain 08/18/2019  . Gastroesophageal reflux disease 06/16/2019  . Smokes 02/03/2018  . Disorder of intervertebral disc of lumbar spine 01/11/2017  . Allergic rhinitis 08/13/2016  . Hyperlipidemia   . Graves disease     Dessie Coma PT DPT  01/30/2021, 4:14 PM  [facility] [address] [place], [place], [address] Phone: [phone]   Fax:  [phone]  Name: Alexandra Randall MRN: [id number] Date of Birth: 21-Sep-1966

## 2021-02-06 ENCOUNTER — Ambulatory Visit: Payer: PRIVATE HEALTH INSURANCE | Admitting: Physical Therapy

## 2021-02-13 ENCOUNTER — Encounter: Payer: Self-pay | Admitting: Physical Therapy

## 2021-02-13 ENCOUNTER — Ambulatory Visit: Payer: PRIVATE HEALTH INSURANCE | Admitting: Physical Therapy

## 2021-02-13 ENCOUNTER — Other Ambulatory Visit: Payer: Self-pay

## 2021-02-13 DIAGNOSIS — R2689 Other abnormalities of gait and mobility: Secondary | ICD-10-CM

## 2021-02-13 DIAGNOSIS — M25671 Stiffness of right ankle, not elsewhere classified: Secondary | ICD-10-CM

## 2021-02-13 DIAGNOSIS — M79671 Pain in right foot: Secondary | ICD-10-CM

## 2021-02-13 NOTE — Therapy (Signed)
[facility] [address] [place], [place], [address] Phone: [phone]   Fax:  [phone]  Physical Therapy Treatment  Patient Details  Name: Alexandra Randall MRN: [id number] Date of Birth: 1966/07/06 Referring Provider (PT): Dr Asencion Islam   Encounter Date: 02/13/2021    PT End of Session - 02/13/21 1052    Visit Number 3    Number of Visits 12    Date for PT Re-Evaluation 03/15/21    Authorization Type Patient can not start until April first    PT Start Time 1017    PT Stop Time 1100    PT Time Calculation (min) 43 min    Activity Tolerance Patient tolerated treatment well    Behavior During Therapy [facility] for tasks assessed/performed           Past Medical History:  Diagnosis Date  . Allergic rhinitis   . Diabetes mellitus without complication (HCC)   . Eczema   . GERD (gastroesophageal reflux disease)   . Graves disease   . Heart murmur   . Hyperlipidemia   . Hyperthyroidism   . Left wrist pain 08/18/2019  . Pansinusitis   . Sinusitis   . Thoracic back pain 03/12/2020  . Vitamin D deficiency     Past Surgical History:  Procedure Laterality Date  . CHOLECYSTECTOMY    . MASS EXCISION Right 04/03/2014   Procedure: EXCISION MASS RIGHT LOWER BACK;  Surgeon: Shelly Rubenstein, MD;  Location: [facility] OR;  Service: General;  Laterality: Right;  . OVARIAN CYST REMOVAL      There were no vitals filed for this visit.   Subjective Assessment - 02/13/21 1020    Subjective Patient reports her foot has been alittle better. She has been working on stretching it.              [facility] PT Assessment - 02/13/21 0001      PROM   Right Ankle Dorsiflexion 0                         [facility] Adult PT Treatment/Exercise - 02/13/21 0001      Manual Therapy   Manual Therapy Joint mobilization;Soft tissue mobilization;Passive ROM    Manual therapy comments great toe ROM    Joint Mobilization posterior talor  glides    Soft tissue mobilization to calf    Passive ROM DF strethcing      Ankle Exercises: Stretches   Other Stretch reviewed strap stretch      Ankle Exercises: Supine   T-Band 4 way t-band yellow 2x10      Ankle Exercises: Seated   Other Seated Ankle Exercises hip abduction yellow 2x10      Ankle Exercises: Standing   Other Standing Ankle Exercises supine march 2x10; SAQ 2x10 significant difficulty with both with the right lieg.                  PT Education - 02/13/21 1448    Education Details updated HEP for strengthening exercises.    Person(s) Educated Patient    Methods Explanation;Demonstration;Tactile cues;Verbal cues    Comprehension Verbalized understanding;Returned demonstration;Verbal cues required;Tactile cues required            PT Short Term Goals - 02/13/21 1504      PT SHORT TERM GOAL #1   Title Patient will tolerate light touch to toes to begin light ROM    Time 3  Period Weeks    Status New    Target Date 01/25/21      PT SHORT TERM GOAL #2   Title Patient will begin woearing a shoe    Time 3    Period Weeks    Status On-going    Target Date 01/25/21      PT SHORT TERM GOAL #3   Title Patient will increase passive DF to 5 degrees without pain    Time 3    Period Weeks    Status On-going    Target Date 01/25/21             PT Long Term Goals - 01/04/21 0732      PT LONG TERM GOAL #1   Title Patient will stand for 30 minutes without pain in order to perfrom ADL's    Time 6    Period Weeks    Status New    Target Date 02/15/21      PT LONG TERM GOAL #2   Title Patient will ambualte 1000' with <2/10 pain in order to perform daily activity    Time 6    Period Weeks    Status New    Target Date 02/15/21                 Plan - 02/13/21 1449    Clinical Impression Statement Patient is making progress. Her DF was measured at neutral with some pain. She reported that her foot has been dragging. She had moderate  difficulty with hip flexion and knee extension on the right side. She was advised to continue working on exercises at home. The patient will continue 1x a week for 4 weeks.    Personal Factors and Comorbidities Comorbidity 1;Comorbidity 2    Comorbidities DMII, right Knee OA    Examination-Activity Limitations Stairs;Stand;Locomotion Level;Transfers;Sit    Examination-Participation Restrictions Cleaning;Meal Prep;Community Activity;Shop    Stability/Clinical Decision Making Evolving/Moderate complexity    Clinical Decision Making Moderate    Rehab Potential Good    PT Frequency 1x / week    PT Duration 6 weeks    PT Treatment/Interventions ADLs/Self Care Home Management;Cryotherapy;Iontophoresis 4mg /ml Dexamethasone;Moist Heat;Ultrasound;DME Instruction;Gait training;Stair training;Functional mobility training;Therapeutic activities;Therapeutic exercise;Neuromuscular re-education;Patient/family education;Manual techniques;Passive range of motion;Dry needling;Splinting;Taping;Scar mobilization    PT Next Visit Plan begin light mobility of toes if aptient can tolerate. Assess improvement in doris flexion; progress to resited exercises if tolerated. add standing weight shifts if able; consider seated ankle series depending on how she is doing. Progress to standing as able; hopefull y the patient brings in shoe.    PT Home Exercise Plan Access Code: [email or web address]  Prepared by:    Program Notes    touch with the softest material you have in the house for  a few seconds. Build the time you can tolerate it up . Move to a little more rough texture example: silk-> cotton ball-soft wash cloth    Exercises  .Seated Ankle Pumps - 3 x daily - 7 x weekly - 2 sets - 10 reps  .Supine Ankle Inversion Eversion AROM - 3 x daily - 7 x weekly - 2 sets - 10 reps  .Long Sitting Calf Stretch with Strap - 3 x daily - 7 x weekly - 1 sets - 3 reps - 20 hold    Consulted and Agree with Plan of Care Patient            Patient will benefit from skilled therapeutic intervention in order  to improve the following deficits and impairments:  Abnormal gait,Difficulty walking,Decreased range of motion,Decreased endurance,Decreased activity tolerance,Pain,Decreased strength,Decreased mobility,Impaired sensation  Visit Diagnosis: Pain in right foot  Other abnormalities of gait and mobility  Stiffness of right ankle, not elsewhere classified     Problem List Patient Active Problem List   Diagnosis Date Noted  . Chronic idiopathic constipation 10/17/2020  . Cervical radicular pain 09/05/2020  . Vitamin D deficiency 04/23/2020  . Type 2 diabetes mellitus without complications (HCC) 03/12/2020  . Foot callus 03/12/2020  . Hypertension associated with diabetes (HCC) 03/12/2020  . Pain in right knee 01/19/2020  . Neck pain 08/18/2019  . Gastroesophageal reflux disease 06/16/2019  . Smokes 02/03/2018  . Disorder of intervertebral disc of lumbar spine 01/11/2017  . Allergic rhinitis 08/13/2016  . Hyperlipidemia   . Graves disease     Dessie Coma PT DPT  02/13/2021, 3:12 PM  [facility] [address] [place], [place], [address] Phone: [phone]   Fax:  [phone]  Name: Alexandra Randall MRN: [id number] Date of Birth: 03-28-1966

## 2021-02-14 DIAGNOSIS — E1140 Type 2 diabetes mellitus with diabetic neuropathy, unspecified: Secondary | ICD-10-CM | POA: Diagnosis not present

## 2021-02-14 DIAGNOSIS — Z79899 Other long term (current) drug therapy: Secondary | ICD-10-CM | POA: Diagnosis not present

## 2021-02-14 DIAGNOSIS — R5383 Other fatigue: Secondary | ICD-10-CM | POA: Diagnosis not present

## 2021-02-14 DIAGNOSIS — J45909 Unspecified asthma, uncomplicated: Secondary | ICD-10-CM | POA: Diagnosis not present

## 2021-02-17 ENCOUNTER — Ambulatory Visit: Payer: PRIVATE HEALTH INSURANCE

## 2021-02-17 ENCOUNTER — Telehealth: Payer: Self-pay

## 2021-02-17 NOTE — Telephone Encounter (Signed)
Spoke with patient regarding missed PT appointment. Patient reports having her days mixed up thinking her appointment was later this week. Was able to reschedule patient to tomorrow.

## 2021-02-18 ENCOUNTER — Ambulatory Visit: Payer: PRIVATE HEALTH INSURANCE

## 2021-02-18 ENCOUNTER — Other Ambulatory Visit: Payer: Self-pay

## 2021-02-18 DIAGNOSIS — M25671 Stiffness of right ankle, not elsewhere classified: Secondary | ICD-10-CM

## 2021-02-18 DIAGNOSIS — R2689 Other abnormalities of gait and mobility: Secondary | ICD-10-CM

## 2021-02-18 DIAGNOSIS — M79671 Pain in right foot: Secondary | ICD-10-CM | POA: Diagnosis not present

## 2021-02-18 NOTE — Therapy (Addendum)
[place], [place], [address] Phone: [phone]   Fax:  [phone]  Physical Therapy Treatment/Discharge   Patient Details  Name: Alexandra Randall MRN: [id number] Date of Birth: 1966/09/14 Referring Provider (PT): Dr Landis Martins   Encounter Date: 02/18/2021   PT End of Session - 02/18/21 1100     Visit Number 4    Number of Visits 12    Date for PT Re-Evaluation 03/15/21    Authorization Type Patient can not start until April first    PT Start Time 1100    PT Stop Time 1143    PT Time Calculation (min) 43 min    Activity Tolerance Patient tolerated treatment well;Patient limited by pain    Behavior During Therapy [facility] for tasks assessed/performed             Past Medical History:  Diagnosis Date   Allergic rhinitis    Diabetes mellitus without complication (HCC)    Eczema    GERD (gastroesophageal reflux disease)    Graves disease    Heart murmur    Hyperlipidemia    Hyperthyroidism    Left wrist pain 08/18/2019   Pansinusitis    Sinusitis    Thoracic back pain 03/12/2020   Vitamin D deficiency     Past Surgical History:  Procedure Laterality Date   CHOLECYSTECTOMY     MASS EXCISION Right 04/03/2014   Procedure: EXCISION MASS RIGHT LOWER BACK;  Surgeon: Harl Bowie, MD;  Location: [place];  Service: General;  Laterality: Right;   OVARIAN CYST REMOVAL      There were no vitals filed for this visit.   Subjective Assessment - 02/18/21 1104     Subjective Patient reports the foot is feeling better, but her Rt knee is hurting. She reports compliance with HEP. She reports wearing bedroom shoes sometimes, but can't tolerate other footwear at this time.    Currently in Pain? Yes    Pain Score 6     Pain Location Calf    Pain Orientation Right    Pain Descriptors / Indicators Aching    Pain Type Chronic pain                [facility] PT Assessment - 02/18/21 0001       PROM    Right Ankle Dorsiflexion 0                           [facility] Adult PT Treatment/Exercise - 02/18/21 0001       Manual Therapy   Manual therapy comments digit 3-5 PROM, desensitization to great toe and 2nd digit    Soft tissue mobilization to calf    Passive ROM DF strethcing      Ankle Exercises: Stretches   Gastroc Stretch 60 seconds      Ankle Exercises: Seated   Ankle Circles/Pumps 10 reps   CW/CCW   Heel Raises 10 reps   x2   Other Seated Ankle Exercises ankle rockerboard A/P and lateral 20 reps each                    PT Education - 02/18/21 1136     Education Details Desensitization techniques and use of heat pack for pain reduction.    Person(s) Educated Patient    Methods Explanation;Demonstration    Comprehension Verbalized understanding;Returned demonstration  PT Short Term Goals - 02/13/21 1504       PT SHORT TERM GOAL #1   Title Patient will tolerate light touch to toes to begin light ROM    Time 3    Period Weeks    Status New    Target Date 01/25/21      PT SHORT TERM GOAL #2   Title Patient will begin woearing a shoe    Time 3    Period Weeks    Status On-going    Target Date 01/25/21      PT SHORT TERM GOAL #3   Title Patient will increase passive DF to 5 degrees without pain    Time 3    Period Weeks    Status On-going    Target Date 01/25/21               PT Long Term Goals - 01/04/21 0732       PT LONG TERM GOAL #1   Title Patient will stand for 30 minutes without pain in order to perfrom ADL's    Time 6    Period Weeks    Status New    Target Date 02/15/21      PT LONG TERM GOAL #2   Title Patient will ambualte 1000' with <2/10 pain in order to perform daily activity    Time 6    Period Weeks    Status New    Target Date 02/15/21                   Plan - 02/18/21 1100     Clinical Impression Statement Her DF AROM remains unchanged compared to last assessment  achieving neutral. She remains hypersensitive to light touch at digits 1-2 and was recommended to complete desensitization techniques as part of HEP to assist in reducing sensitivity in order to allow for ROM activity and tolerance to close-toed shoes. Fair tolerance to today's session as she reported pain with all gentle ROM activity.    Personal Factors and Comorbidities Comorbidity 1;Comorbidity 2    Comorbidities DMII, right Knee OA    Examination-Activity Limitations Stairs;Stand;Locomotion Level;Transfers;Sit    Examination-Participation Restrictions Cleaning;Meal Prep;Community Activity;Shop    Stability/Clinical Decision Making Evolving/Moderate complexity    Rehab Potential Good    PT Frequency 1x / week    PT Duration 6 weeks    PT Treatment/Interventions ADLs/Self Care Home Management;Cryotherapy;Iontophoresis 4mg /ml Dexamethasone;Moist Heat;Ultrasound;DME Instruction;Gait training;Stair training;Functional mobility training;Therapeutic activities;Therapeutic exercise;Neuromuscular re-education;Patient/family education;Manual techniques;Passive range of motion;Dry needling;Splinting;Taping;Scar mobilization    PT Next Visit Plan begin light mobility of toes if aptient can tolerate. Assess improvement in doris flexion; progress to resited exercises if tolerated. add standing weight shifts if able; consider seated ankle series depending on how she is doing. Progress to standing as able; hopefull y the patient brings in shoe.    PT Home Exercise Plan Access Code: [email or web address]  Prepared by: Carolyne Littles    Program Notes    touch with the softest material you have in the house for  a few seconds. Build the time you can tolerate it up . Move to a little more rough texture example: silk-> cotton ball-soft wash cloth    Exercises  Seated Ankle Pumps - 3 x daily - 7 x weekly - 2 sets - 10 reps  Supine Ankle Inversion Eversion AROM - 3 x daily - 7 x weekly - 2 sets - 10 reps  Long Sitting Calf Stretch with  Strap -  3 x daily - 7 x weekly - 1 sets - 3 reps - 20 hold    Consulted and Agree with Plan of Care Patient             Patient will benefit from skilled therapeutic intervention in order to improve the following deficits and impairments:  Abnormal gait,Difficulty walking,Decreased range of motion,Decreased endurance,Decreased activity tolerance,Pain,Decreased strength,Decreased mobility,Impaired sensation  Visit Diagnosis: Pain in right foot  Other abnormalities of gait and mobility  Stiffness of right ankle, not elsewhere classified     Problem List Patient Active Problem List   Diagnosis Date Noted   Chronic idiopathic constipation 10/17/2020   Cervical radicular pain 09/05/2020   Vitamin D deficiency 04/23/2020   Type 2 diabetes mellitus without complications ([place]) 93/81/0175   Foot callus 03/12/2020   Hypertension associated with diabetes ([place]) 03/12/2020   Pain in right knee 01/19/2020   Neck pain 08/18/2019   Gastroesophageal reflux disease 06/16/2019   Smokes 02/03/2018   Disorder of intervertebral disc of lumbar spine 01/11/2017   Allergic rhinitis 08/13/2016   Hyperlipidemia    Graves disease    Gwendolyn Grant, PT, DPT, ATC 02/18/21 11:46 AM PHYSICAL THERAPY DISCHARGE SUMMARY  Visits from Start of Care: 4  Current functional level related to goals / functional outcomes: Goals were not met.    Remaining deficits: Status unknown   Education / Equipment: N/A   Plan: Patient agrees to discharge.  Patient goals were not met. Patient is being discharged due to not returning since last visit.     Gwendolyn Grant, PT, DPT, ATC 04/03/21 10:52 AM  [facility] [address] [place], [place], [address] Phone: [phone]   Fax:  [phone]  Name: ERNESTYNE CALDWELL MRN: [id number] Date of Birth: [birth year]

## 2021-02-27 ENCOUNTER — Ambulatory Visit: Payer: PRIVATE HEALTH INSURANCE | Admitting: Physical Therapy

## 2021-03-06 ENCOUNTER — Encounter: Payer: PRIVATE HEALTH INSURANCE | Admitting: Physical Therapy

## 2021-03-20 ENCOUNTER — Encounter: Payer: PRIVATE HEALTH INSURANCE | Admitting: Physical Therapy

## 2021-03-20 ENCOUNTER — Ambulatory Visit: Payer: Self-pay | Admitting: Sports Medicine

## 2021-08-02 ENCOUNTER — Other Ambulatory Visit: Payer: Self-pay | Admitting: Family Medicine

## 2022-07-06 ENCOUNTER — Other Ambulatory Visit: Payer: Self-pay | Admitting: Internal Medicine

## 2022-07-06 DIAGNOSIS — Z1231 Encounter for screening mammogram for malignant neoplasm of breast: Secondary | ICD-10-CM

## 2022-10-09 ENCOUNTER — Ambulatory Visit: Payer: Self-pay

## 2022-12-01 ENCOUNTER — Ambulatory Visit: Payer: Self-pay

## 2023-02-24 ENCOUNTER — Emergency Department (HOSPITAL_COMMUNITY): Payer: Self-pay

## 2023-02-24 ENCOUNTER — Emergency Department (HOSPITAL_COMMUNITY)
Admission: EM | Admit: 2023-02-24 | Discharge: 2023-02-25 | Disposition: A | Discharge status: Home / Self Care | Payer: Self-pay | Attending: Emergency Medicine | Admitting: Emergency Medicine

## 2023-02-24 ENCOUNTER — Encounter (HOSPITAL_COMMUNITY): Payer: Self-pay

## 2023-02-24 ENCOUNTER — Other Ambulatory Visit: Payer: Self-pay

## 2023-02-24 DIAGNOSIS — N83202 Unspecified ovarian cyst, left side: Secondary | ICD-10-CM | POA: Insufficient documentation

## 2023-02-24 DIAGNOSIS — N83201 Unspecified ovarian cyst, right side: Secondary | ICD-10-CM | POA: Insufficient documentation

## 2023-02-24 DIAGNOSIS — R1084 Generalized abdominal pain: Secondary | ICD-10-CM

## 2023-02-24 LAB — COMPREHENSIVE METABOLIC PANEL
ALT: 24 U/L (ref 0–44)
AST: 21 U/L (ref 15–41)
Albumin: 3.8 g/dL (ref 3.5–5.0)
Alkaline Phosphatase: 90 U/L (ref 38–126)
Anion gap: 9 (ref 5–15)
BUN: 9 mg/dL (ref 6–20)
CO2: 24 mmol/L (ref 22–32)
Calcium: 8.8 mg/dL — ABNORMAL LOW (ref 8.9–10.3)
Chloride: 103 mmol/L (ref 98–111)
Creatinine, Ser: 0.95 mg/dL (ref 0.44–1.00)
GFR, Estimated: >60 mL/min (ref >60)
Glucose, Bld: 106 mg/dL — ABNORMAL HIGH (ref 70–99)
Potassium: 3.7 mmol/L (ref 3.5–5.1)
Sodium: 136 mmol/L (ref 135–145)
Total Bilirubin: 0.4 mg/dL (ref 0.3–1.2)
Total Protein: 7 g/dL (ref 6.5–8.1)

## 2023-02-24 LAB — URINALYSIS, ROUTINE W REFLEX MICROSCOPIC
Bilirubin Urine: NEGATIVE
Glucose, UA: NEGATIVE mg/dL
Hgb urine dipstick: NEGATIVE
Ketones, ur: NEGATIVE mg/dL
Leukocytes,Ua: NEGATIVE
Nitrite: NEGATIVE
Protein, ur: NEGATIVE mg/dL
Specific Gravity, Urine: 1.016 (ref 1.005–1.030)
pH: 6 (ref 5.0–8.0)

## 2023-02-24 LAB — CBC
HCT: 36.5 % (ref 36.0–46.0)
Hemoglobin: 12.7 g/dL (ref 12.0–15.0)
MCH: 29.2 pg (ref 26.0–34.0)
MCHC: 34.8 g/dL (ref 30.0–36.0)
MCV: 83.9 fL (ref 80.0–100.0)
Platelets: 286 10*3/uL (ref 150–400)
RBC: 4.35 MIL/uL (ref 3.87–5.11)
RDW: 13.3 % (ref 11.5–15.5)
WBC: 5.3 10*3/uL (ref 4.0–10.5)
nRBC: 0 % (ref 0.0–0.2)

## 2023-02-24 LAB — LIPASE, BLOOD: Lipase: 27 U/L (ref 11–51)

## 2023-02-24 NOTE — ED Provider Notes (Signed)
[place] EMERGENCY DEPARTMENT AT [facility] Provider Note   CSN: [id number] Arrival date & time: 02/24/23  1729     History  Chief Complaint  Patient presents with   Abdominal Pain   Back Pain   Lt Side Pain    Alexandra Randall is a 57 y.o. female.  57 year old female with prior medical history as detailed below presents for evaluation.  Patient reports that approximately 4 days ago she felt like she might be constipated.  She used laxatives at home with minimal improvement.  Subsequent to using the laxative she has developed crampy abdominal pain that "moves around" .  She denies fever.  She denies vomiting.  She reports that she feels bloated.  She reports increased belching.  She cannot localize a specific area of abdominal discomfort.  She points to both the left upper and right upper quadrants.  She appears comfortable at time of evaluation.  The history is provided by the patient and medical records.       Home Medications Prior to Admission medications   Medication Sig Start Date End Date Taking? Authorizing Provider  atorvastatin (LIPITOR) 40 MG tablet Take 40 mg by mouth daily.  03/09/14   [provider]  baclofen (LIORESAL) 10 MG tablet Take 1 tablet (10 mg total) by mouth 3 (three) times daily as needed for muscle spasms. Try 1/2 tablet first 09/05/20   Allayne Stack, DO  Blood Glucose Monitoring Suppl (ONE TOUCH ULTRA 2) w/Device KIT USE AS DIRECTED TO CHECK BLOOD SUGAR DAILY 12/19/15   [provider]  Cholecalciferol (VITAMIN D) 2000 units CAPS Take by mouth.    [provider]  docusate sodium (COLACE) 100 MG capsule Take 1 capsule (100 mg total) by mouth 2 (two) times daily. 09/30/20   Asencion Islam, DPM  gabapentin (NEURONTIN) 300 MG capsule Take 1 capsule (300 mg total) by mouth 2 (two) times daily. 12/19/20   Asencion Islam, DPM  ibuprofen (ADVIL) 800 MG tablet Take 1 tablet (800 mg total) by mouth every 8 (eight)  hours as needed. 09/30/20   Asencion Islam, DPM  ibuprofen (ADVIL) 800 MG tablet Take 1 tablet (800 mg total) by mouth every 8 (eight) hours as needed. 10/10/20   Asencion Islam, DPM  ketoconazole (NIZORAL) 2 % cream Apply 1 application topically daily. 08/01/20   Asencion Islam, DPM  lisinopril-hydrochlorothiazide (ZESTORETIC) 20-12.5 MG tablet TAKE 1 TABLET BY MOUTH EVERY DAY 10/14/20   Allayne Stack, DO  loratadine (CLARITIN) 10 MG tablet Take 1 tablet (10 mg total) by mouth daily. 07/26/20   Simmons-Robinson, Makiera, MD  methimazole (TAPAZOLE) 5 MG tablet TAKE 1 TABLET BY MOUTH EVERY DAY 10/14/20   Leticia Penna N, DO  ONE TOUCH ULTRA TEST test strip USE AS DIRECTED TO CHECK BLOOD SUGAR DAILY 12/19/15   [provider]  Probiotic Product (PROBIOTIC PO) Take by mouth daily.    [provider]  promethazine (PHENERGAN) 12.5 MG tablet Take 1 tablet (12.5 mg total) by mouth every 8 (eight) hours as needed for nausea or vomiting. 09/30/20   Asencion Islam, DPM      Allergies    Hydrocodone    Review of Systems   Review of Systems  All other systems reviewed and are negative.   Physical Exam Updated Vital Signs BP (!) 146/82 (BP Location: Right Arm)   Pulse (!) 54   Temp 98.6 F (37 C) (Oral)   Resp 16   LMP 10/12/2016 (Approximate)  SpO2 100%  Physical Exam Vitals and nursing note reviewed.  Constitutional:      General: She is not in acute distress.    Appearance: Normal appearance. She is well-developed.  HENT:     Head: Normocephalic and atraumatic.  Eyes:     Conjunctiva/sclera: Conjunctivae normal.     Pupils: Pupils are equal, round, and reactive to light.  Cardiovascular:     Rate and Rhythm: Normal rate and regular rhythm.     Heart sounds: Normal heart sounds.  Pulmonary:     Effort: Pulmonary effort is normal. No respiratory distress.     Breath sounds: Normal breath sounds.  Abdominal:     General: There is no distension.      Palpations: Abdomen is soft.     Tenderness: There is generalized abdominal tenderness.  Musculoskeletal:        General: No deformity. Normal range of motion.     Cervical back: Normal range of motion and neck supple.  Skin:    General: Skin is warm and dry.  Neurological:     General: No focal deficit present.     Mental Status: She is alert and oriented to person, place, and time.     ED Results / Procedures / Treatments   Labs (all labs ordered are listed, but only abnormal results are displayed) Labs Reviewed  COMPREHENSIVE METABOLIC PANEL - Abnormal; Notable for the following components:      Result Value   Glucose, Bld 106 (*)    Calcium 8.8 (*)    All other components within normal limits  URINALYSIS, ROUTINE W REFLEX MICROSCOPIC - Abnormal; Notable for the following components:   Bacteria, UA RARE (*)    All other components within normal limits  LIPASE, BLOOD  CBC    EKG None  Radiology No results found.  Procedures Procedures    Medications Ordered in ED Medications - No data to display  ED Course/ Medical Decision Making/ A&P                             Medical Decision Making Amount and/or Complexity of Data Reviewed Labs: ordered. Radiology: ordered.    Medical Screen Complete  This patient presented to the ED with complaint of abdominal pain.  This complaint involves an extensive number of treatment options. The initial differential diagnosis includes, but is not limited to, metabolic abnormality, UTI, infection, etc.  This presentation is: Acute, Self-Limited, Previously Undiagnosed, Uncertain Prognosis, Complicated, Systemic Symptoms, and Threat to Life/Bodily Function  Patient is presenting with abdominal discomfort.  Describes symptoms are not typical of significant acute pathology.  Patient's describe symptoms are concurrent with recent use of laxatives.  Symptoms could be related to laxative use and subsequent gaseous distention of  the intestines.  Screening labs obtained are without significant abnormalities.  CT imaging pending.  Oncoming ED provider aware of case.  Patient is appropriate for discharge if CT imaging is without acute abnormality.  Additional history obtained:  External records from outside sources obtained and reviewed including prior ED visits and prior Inpatient records.    Lab Tests:  I ordered and personally interpreted labs.  The pertinent results include: CBC, CMP, lipase, UA   Imaging Studies ordered:  I ordered imaging studies including CT abdomen pelvis  I agree with the radiologist interpretation.  Problem List / ED Course:  Abdominal pain   Reevaluation:  After the interventions noted above, I reevaluated  the patient and found that they have: improved   Disposition:  After consideration of the diagnostic results and the patients response to treatment, I feel that the patent would benefit from close outpatient follow-up.          Final Clinical Impression(s) / ED Diagnoses Final diagnoses:  Generalized abdominal pain  Bilateral ovarian cysts    Rx / DC Orders ED Discharge Orders     None         Wynetta Fines, MD 02/25/23 0004

## 2023-02-24 NOTE — ED Triage Notes (Signed)
Pt came in via POV d/t 7/10 intermittent mid/upper abd pain that moved to under her Lt breast & then around to across her back. Reports she had also been having problems having a BM & for the past "week & a half" has took stool softeners with minimal relief, still feeling bloated. Denies n/v/d.

## 2023-02-24 NOTE — Discharge Instructions (Addendum)
Return for any problem.    Your CT scan showed incidental ovarian cysts which are likely not the cause of your pain.  However, recommended follow-up with [place] in 6-12 is recommended.

## 2023-02-25 NOTE — ED Provider Notes (Signed)
Patient signed out pending CT. CT reviewed.  Largely unremarkable.  She does have bilateral ovarian cysts which need 6 to 44-month ultrasound follow-up.  I did provide this information to the patient.  She was otherwise discharged per Dr. Jonette Eva instructions.   Shon Baton, MD 02/25/23 [phone]
